# Patient Record
Sex: Female | Born: 1950
Health system: Southern US, Community
[De-identification: ages and names within clinical notes are randomized; demographics above are authoritative.]

## PROBLEM LIST (undated history)

## (undated) DIAGNOSIS — E785 Hyperlipidemia, unspecified: Secondary | ICD-10-CM

## (undated) DIAGNOSIS — IMO0002 Reserved for concepts with insufficient information to code with codable children: Secondary | ICD-10-CM

## (undated) DIAGNOSIS — E079 Disorder of thyroid, unspecified: Secondary | ICD-10-CM

## (undated) DIAGNOSIS — F329 Major depressive disorder, single episode, unspecified: Secondary | ICD-10-CM

## (undated) DIAGNOSIS — M1990 Unspecified osteoarthritis, unspecified site: Secondary | ICD-10-CM

## (undated) DIAGNOSIS — F419 Anxiety disorder, unspecified: Secondary | ICD-10-CM

## (undated) DIAGNOSIS — J449 Chronic obstructive pulmonary disease, unspecified: Secondary | ICD-10-CM

## (undated) DIAGNOSIS — K219 Gastro-esophageal reflux disease without esophagitis: Secondary | ICD-10-CM

## (undated) DIAGNOSIS — T7840XA Allergy, unspecified, initial encounter: Secondary | ICD-10-CM

## (undated) DIAGNOSIS — M810 Age-related osteoporosis without current pathological fracture: Secondary | ICD-10-CM

## (undated) DIAGNOSIS — F32A Depression, unspecified: Secondary | ICD-10-CM

## (undated) HISTORY — DX: Allergy, unspecified, initial encounter: T78.40XA

## (undated) HISTORY — DX: Age-related osteoporosis without current pathological fracture: M81.0

## (undated) HISTORY — DX: Major depressive disorder, single episode, unspecified: F32.9

## (undated) HISTORY — DX: Anxiety disorder, unspecified: F41.9

## (undated) HISTORY — DX: Hyperlipidemia, unspecified: E78.5

## (undated) HISTORY — DX: Disorder of thyroid, unspecified: E07.9

## (undated) HISTORY — DX: Reserved for concepts with insufficient information to code with codable children: IMO0002

## (undated) HISTORY — DX: Gastro-esophageal reflux disease without esophagitis: K21.9

## (undated) HISTORY — DX: Chronic obstructive pulmonary disease, unspecified: J44.9

## (undated) HISTORY — DX: Depression, unspecified: F32.A

## (undated) HISTORY — DX: Unspecified osteoarthritis, unspecified site: M19.90

---

## 1998-09-14 ENCOUNTER — Ambulatory Visit (HOSPITAL_COMMUNITY): Admission: RE | Admit: 1998-09-14 | Discharge: 1998-09-14 | Payer: Self-pay

## 1998-09-14 ENCOUNTER — Encounter: Payer: Self-pay | Admitting: Family Medicine

## 1998-10-01 ENCOUNTER — Emergency Department (HOSPITAL_COMMUNITY): Admission: EM | Admit: 1998-10-01 | Discharge: 1998-10-01 | Payer: Self-pay | Admitting: Emergency Medicine

## 1998-10-12 ENCOUNTER — Other Ambulatory Visit: Admission: RE | Admit: 1998-10-12 | Discharge: 1998-10-12 | Payer: Self-pay | Admitting: Obstetrics & Gynecology

## 2000-03-22 ENCOUNTER — Other Ambulatory Visit: Admission: RE | Admit: 2000-03-22 | Discharge: 2000-03-22 | Payer: Self-pay | Admitting: Obstetrics and Gynecology

## 2001-04-04 ENCOUNTER — Other Ambulatory Visit: Admission: RE | Admit: 2001-04-04 | Discharge: 2001-04-04 | Payer: Self-pay | Admitting: Obstetrics and Gynecology

## 2002-04-17 ENCOUNTER — Other Ambulatory Visit: Admission: RE | Admit: 2002-04-17 | Discharge: 2002-04-17 | Payer: Self-pay | Admitting: Obstetrics and Gynecology

## 2003-03-01 ENCOUNTER — Emergency Department (HOSPITAL_COMMUNITY): Admission: EM | Admit: 2003-03-01 | Discharge: 2003-03-01 | Payer: Self-pay | Admitting: Emergency Medicine

## 2003-04-20 ENCOUNTER — Other Ambulatory Visit: Admission: RE | Admit: 2003-04-20 | Discharge: 2003-04-20 | Payer: Self-pay | Admitting: Obstetrics and Gynecology

## 2003-07-13 ENCOUNTER — Ambulatory Visit (HOSPITAL_COMMUNITY): Admission: RE | Admit: 2003-07-13 | Discharge: 2003-07-13 | Payer: Self-pay | Admitting: Family Medicine

## 2004-04-21 ENCOUNTER — Other Ambulatory Visit: Admission: RE | Admit: 2004-04-21 | Discharge: 2004-04-21 | Payer: Self-pay | Admitting: Obstetrics and Gynecology

## 2004-06-14 ENCOUNTER — Ambulatory Visit (HOSPITAL_COMMUNITY): Admission: RE | Admit: 2004-06-14 | Discharge: 2004-06-14 | Payer: Self-pay | Admitting: Family Medicine

## 2005-04-13 ENCOUNTER — Other Ambulatory Visit: Admission: RE | Admit: 2005-04-13 | Discharge: 2005-04-13 | Payer: Self-pay | Admitting: Obstetrics and Gynecology

## 2007-05-20 ENCOUNTER — Emergency Department (HOSPITAL_COMMUNITY): Admission: EM | Admit: 2007-05-20 | Discharge: 2007-05-20 | Payer: Self-pay | Admitting: Emergency Medicine

## 2007-06-27 LAB — HM MAMMOGRAPHY: HM Mammogram: NEGATIVE

## 2007-09-19 ENCOUNTER — Encounter: Admission: RE | Admit: 2007-09-19 | Discharge: 2007-09-19 | Payer: Self-pay | Admitting: Family Medicine

## 2008-07-26 ENCOUNTER — Emergency Department (HOSPITAL_BASED_OUTPATIENT_CLINIC_OR_DEPARTMENT_OTHER): Admission: EM | Admit: 2008-07-26 | Discharge: 2008-07-26 | Payer: Self-pay | Admitting: Emergency Medicine

## 2008-07-26 ENCOUNTER — Ambulatory Visit: Payer: Self-pay | Admitting: Diagnostic Radiology

## 2009-01-05 IMAGING — MG MM SCREEN MAMMOGRAM BILATERAL
4 series · 4 of 4 positions shown · non-contrast
Comparison: none

DG SCREEN MAMMOGRAM BILATERAL
Bilateral CC and MLO view(s) were taken.

DIGITAL SCREENING MAMMOGRAM WITH CAD:
The breast tissue is heterogeneously dense.  No masses or malignant type calcifications are 
identified.

[R CC]
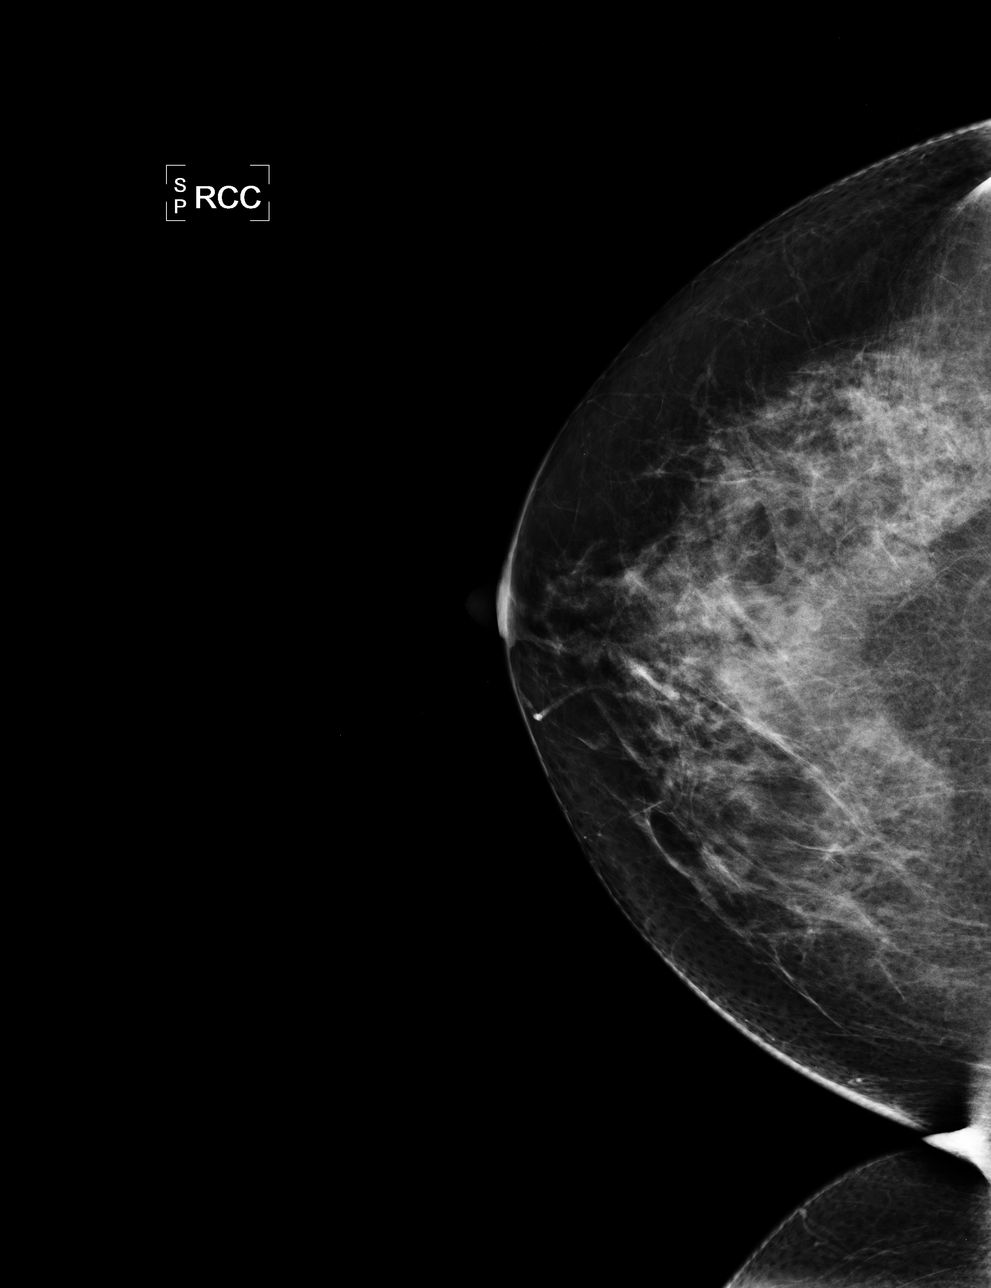

[L CC]
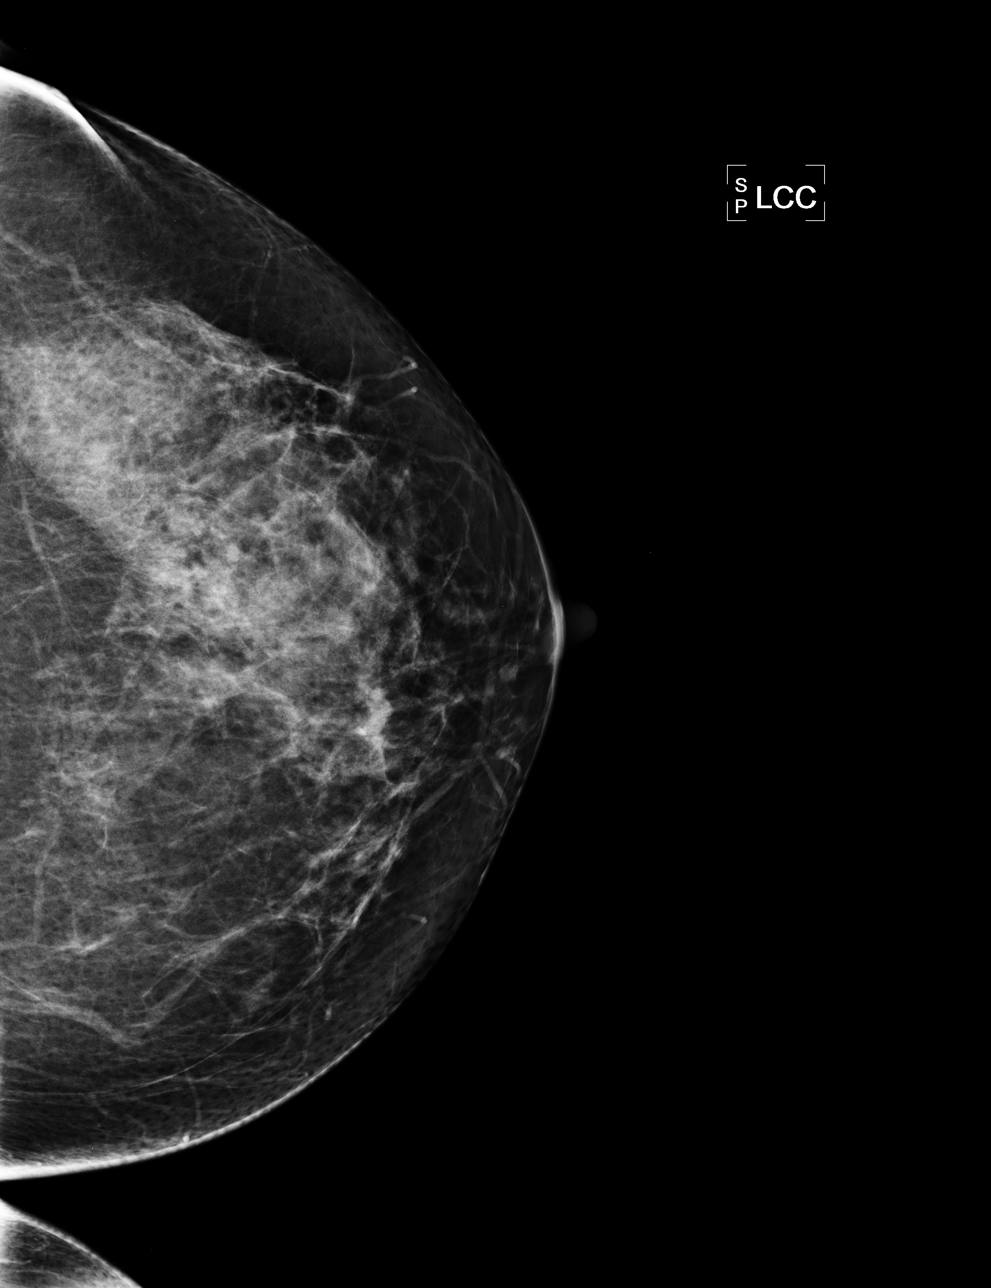

[L MLO]
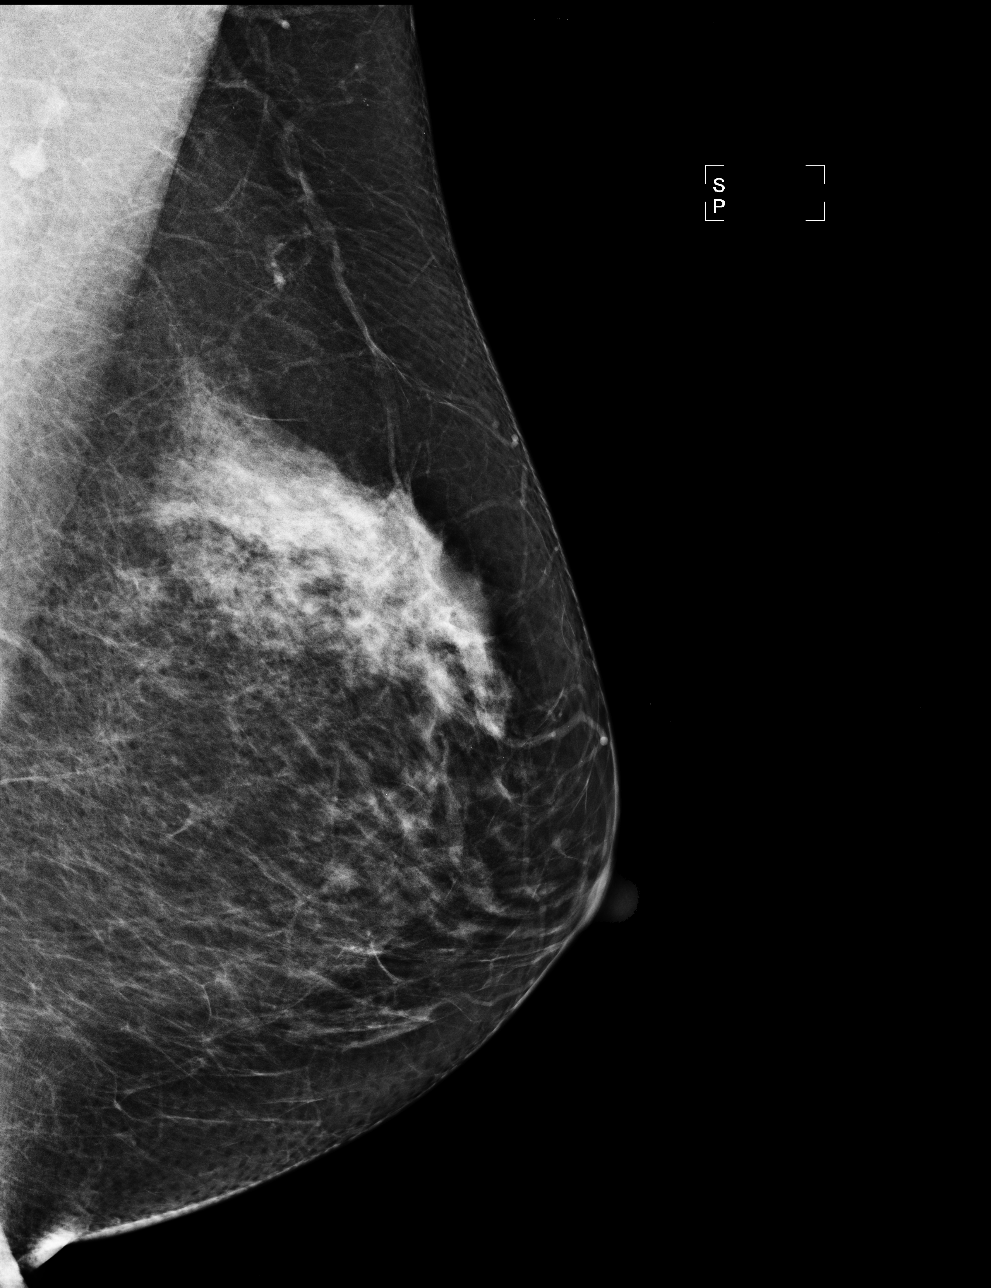

[R MLO]
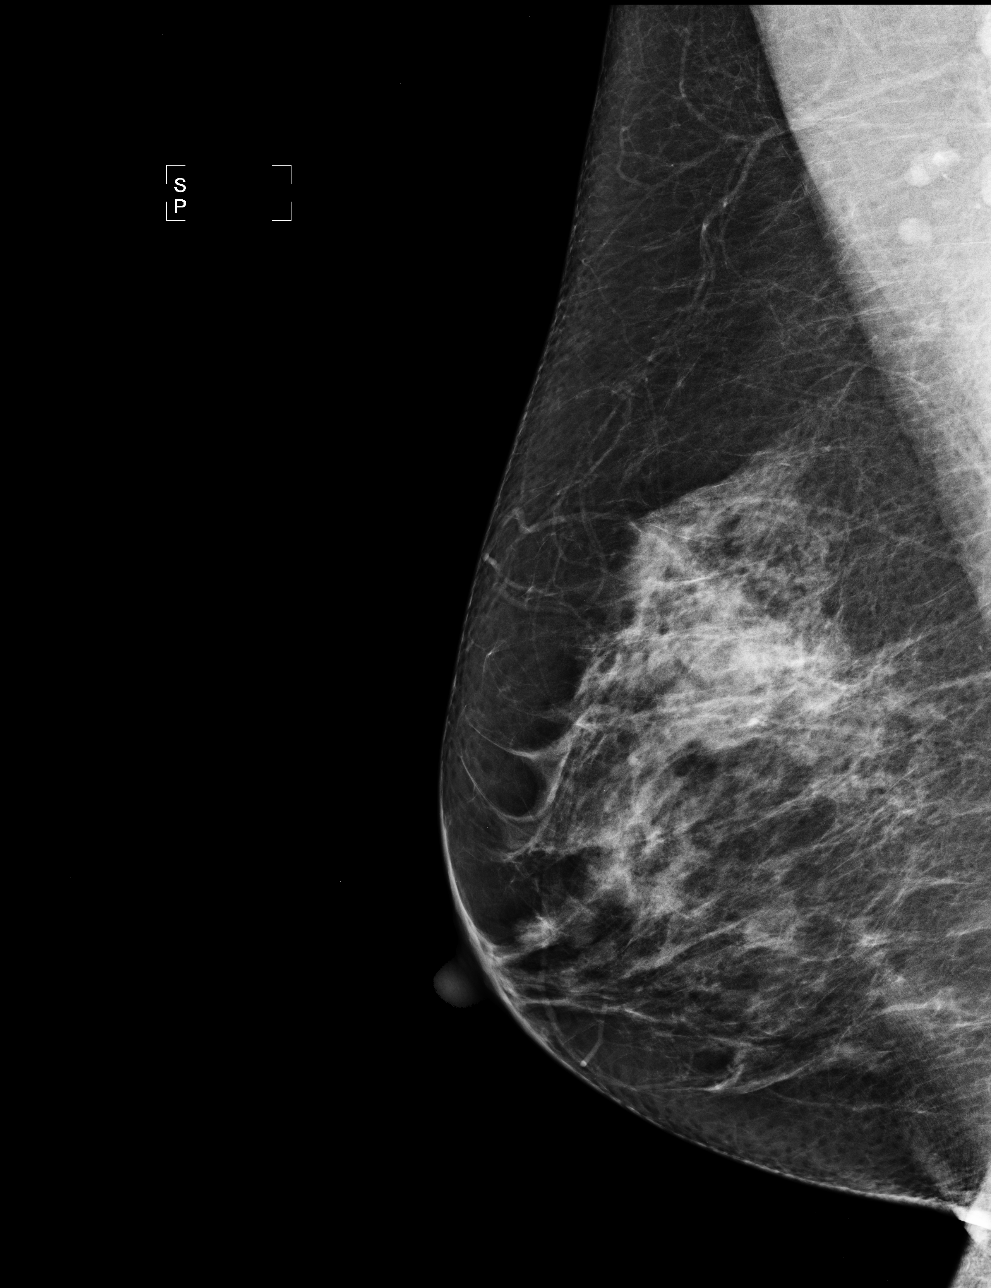

[4 of 4 positions shown; findings below may reference images not displayed]

IMPRESSION: No specific mammographic evidence of malignancy.  Next screening mammogram is recommended in one 
year.

ASSESSMENT: Negative - BI-RADS 1

Screening mammogram in 1 year.
ANALYZED BY COMPUTER AIDED DETECTION. , THIS PROCEDURE WAS A DIGITAL MAMMOGRAM.

## 2009-11-24 LAB — HM PAP SMEAR: HM Pap smear: NORMAL

## 2010-04-21 ENCOUNTER — Ambulatory Visit: Payer: Self-pay | Admitting: Diagnostic Radiology

## 2010-04-21 ENCOUNTER — Emergency Department (HOSPITAL_BASED_OUTPATIENT_CLINIC_OR_DEPARTMENT_OTHER): Admission: EM | Admit: 2010-04-21 | Discharge: 2010-04-21 | Payer: Self-pay | Admitting: Emergency Medicine

## 2010-07-10 ENCOUNTER — Emergency Department (HOSPITAL_BASED_OUTPATIENT_CLINIC_OR_DEPARTMENT_OTHER)
Admission: EM | Admit: 2010-07-10 | Discharge: 2010-07-10 | Payer: Self-pay | Source: Home / Self Care | Admitting: Emergency Medicine

## 2010-07-11 LAB — URINALYSIS, ROUTINE W REFLEX MICROSCOPIC
Bilirubin Urine: NEGATIVE
Ketones, ur: NEGATIVE mg/dL
Leukocytes, UA: NEGATIVE
Nitrite: NEGATIVE
Protein, ur: NEGATIVE mg/dL
Specific Gravity, Urine: 1.003 — ABNORMAL LOW (ref 1.005–1.030)
Urine Glucose, Fasting: NEGATIVE mg/dL
Urobilinogen, UA: 0.2 mg/dL (ref 0.0–1.0)
pH: 6 (ref 5.0–8.0)

## 2010-07-11 LAB — DIFFERENTIAL
Basophils Absolute: 0 10*3/uL (ref 0.0–0.1)
Basophils Relative: 0 % (ref 0–1)
Eosinophils Absolute: 0.1 10*3/uL (ref 0.0–0.7)
Eosinophils Relative: 1 % (ref 0–5)
Lymphocytes Relative: 31 % (ref 12–46)
Lymphs Abs: 2.6 10*3/uL (ref 0.7–4.0)
Monocytes Absolute: 0.7 10*3/uL (ref 0.1–1.0)
Monocytes Relative: 8 % (ref 3–12)
Neutro Abs: 4.9 10*3/uL (ref 1.7–7.7)
Neutrophils Relative %: 59 % (ref 43–77)

## 2010-07-11 LAB — CBC
HCT: 39 % (ref 36.0–46.0)
Hemoglobin: 13.4 g/dL (ref 12.0–15.0)
MCH: 30.3 pg (ref 26.0–34.0)
MCHC: 34.4 g/dL (ref 30.0–36.0)
MCV: 88.2 fL (ref 78.0–100.0)
Platelets: 266 10*3/uL (ref 150–400)
RBC: 4.42 MIL/uL (ref 3.87–5.11)
RDW: 13 % (ref 11.5–15.5)
WBC: 8.2 10*3/uL (ref 4.0–10.5)

## 2010-07-11 LAB — POCT CARDIAC MARKERS
CKMB, poc: <1 ng/mL — ABNORMAL LOW (ref 1.0–8.0)
Myoglobin, poc: 41.8 ng/mL (ref 12–200)
Troponin i, poc: <0.05 ng/mL (ref 0.00–0.09)

## 2010-07-11 LAB — BASIC METABOLIC PANEL
BUN: 11 mg/dL (ref 6–23)
CO2: 28 mEq/L (ref 19–32)
Calcium: 10.1 mg/dL (ref 8.4–10.5)
Chloride: 108 mEq/L (ref 96–112)
Creatinine, Ser: 0.8 mg/dL (ref 0.4–1.2)
GFR calc Af Amer: >60 mL/min (ref >60)
GFR calc non Af Amer: >60 mL/min (ref >60)
Glucose, Bld: 102 mg/dL — ABNORMAL HIGH (ref 70–99)
Potassium: 4.8 mEq/L (ref 3.5–5.1)
Sodium: 146 mEq/L — ABNORMAL HIGH (ref 135–145)

## 2010-07-11 LAB — URINE MICROSCOPIC-ADD ON

## 2010-09-07 LAB — BASIC METABOLIC PANEL
BUN: 10 mg/dL (ref 6–23)
CO2: 28 mEq/L (ref 19–32)
Calcium: 10.1 mg/dL (ref 8.4–10.5)
Chloride: 107 mEq/L (ref 96–112)
Creatinine, Ser: 0.9 mg/dL (ref 0.4–1.2)
GFR calc Af Amer: >60 mL/min (ref >60)
GFR calc non Af Amer: >60 mL/min (ref >60)
Glucose, Bld: 112 mg/dL — ABNORMAL HIGH (ref 70–99)
Potassium: 4 mEq/L (ref 3.5–5.1)
Sodium: 146 mEq/L — ABNORMAL HIGH (ref 135–145)

## 2010-09-07 LAB — DIFFERENTIAL
Basophils Absolute: 0.1 10*3/uL (ref 0.0–0.1)
Basophils Relative: 1 % (ref 0–1)
Eosinophils Absolute: 0.2 10*3/uL (ref 0.0–0.7)
Eosinophils Relative: 2 % (ref 0–5)
Lymphocytes Relative: 29 % (ref 12–46)
Lymphs Abs: 2.4 10*3/uL (ref 0.7–4.0)
Monocytes Absolute: 0.5 10*3/uL (ref 0.1–1.0)
Monocytes Relative: 6 % (ref 3–12)
Neutro Abs: 5.1 10*3/uL (ref 1.7–7.7)
Neutrophils Relative %: 63 % (ref 43–77)

## 2010-09-07 LAB — CBC
HCT: 40.3 % (ref 36.0–46.0)
Hemoglobin: 14.4 g/dL (ref 12.0–15.0)
MCH: 31.8 pg (ref 26.0–34.0)
MCHC: 35.7 g/dL (ref 30.0–36.0)
MCV: 89.1 fL (ref 78.0–100.0)
Platelets: 268 10*3/uL (ref 150–400)
RBC: 4.52 MIL/uL (ref 3.87–5.11)
RDW: 12.3 % (ref 11.5–15.5)
WBC: 8.3 10*3/uL (ref 4.0–10.5)

## 2010-10-10 LAB — DIFFERENTIAL
Basophils Absolute: 0.1 10*3/uL (ref 0.0–0.1)
Basophils Relative: 1 % (ref 0–1)
Eosinophils Absolute: 0.2 10*3/uL (ref 0.0–0.7)
Eosinophils Relative: 3 % (ref 0–5)
Lymphocytes Relative: 34 % (ref 12–46)
Lymphs Abs: 2.2 10*3/uL (ref 0.7–4.0)
Monocytes Absolute: 0.4 10*3/uL (ref 0.1–1.0)
Monocytes Relative: 7 % (ref 3–12)
Neutro Abs: 3.6 10*3/uL (ref 1.7–7.7)
Neutrophils Relative %: 56 % (ref 43–77)

## 2010-10-10 LAB — BASIC METABOLIC PANEL
BUN: 12 mg/dL (ref 6–23)
CO2: 29 mEq/L (ref 19–32)
Calcium: 9.6 mg/dL (ref 8.4–10.5)
Chloride: 105 mEq/L (ref 96–112)
Creatinine, Ser: 0.9 mg/dL (ref 0.4–1.2)
GFR calc Af Amer: >60 mL/min (ref >60)
GFR calc non Af Amer: >60 mL/min (ref >60)
Glucose, Bld: 116 mg/dL — ABNORMAL HIGH (ref 70–99)
Potassium: 4.5 mEq/L (ref 3.5–5.1)
Sodium: 141 mEq/L (ref 135–145)

## 2010-10-10 LAB — CBC
HCT: 41.1 % (ref 36.0–46.0)
Hemoglobin: 14.1 g/dL (ref 12.0–15.0)
MCHC: 34.3 g/dL (ref 30.0–36.0)
MCV: 92.3 fL (ref 78.0–100.0)
Platelets: 249 10*3/uL (ref 150–400)
RBC: 4.46 MIL/uL (ref 3.87–5.11)
RDW: 12.4 % (ref 11.5–15.5)
WBC: 6.5 10*3/uL (ref 4.0–10.5)

## 2010-10-10 LAB — POCT CARDIAC MARKERS
CKMB, poc: <1 ng/mL — ABNORMAL LOW (ref 1.0–8.0)
Myoglobin, poc: 30.3 ng/mL (ref 12–200)
Troponin i, poc: <0.05 ng/mL (ref 0.00–0.09)

## 2011-02-16 ENCOUNTER — Encounter: Payer: Self-pay | Admitting: Internal Medicine

## 2011-02-16 ENCOUNTER — Ambulatory Visit (INDEPENDENT_AMBULATORY_CARE_PROVIDER_SITE_OTHER): Payer: BC Managed Care – PPO | Admitting: Internal Medicine

## 2011-02-16 VITALS — BP 126/80 | HR 80 | Temp 98.3°F | Resp 16 | Ht 63.5 in | Wt 160.0 lb

## 2011-02-16 DIAGNOSIS — Z0000 Encounter for general adult medical examination without abnormal findings: Secondary | ICD-10-CM

## 2011-02-16 LAB — CBC WITH DIFFERENTIAL/PLATELET
Basophils Relative: 0.5 % (ref 0.0–3.0)
Hemoglobin: 14.5 g/dL (ref 12.0–15.0)
Lymphocytes Relative: 36.8 % (ref 12.0–46.0)
Monocytes Relative: 5.1 % (ref 3.0–12.0)
Neutro Abs: 4.1 10*3/uL (ref 1.4–7.7)
Neutrophils Relative %: 55.7 % (ref 43.0–77.0)
RBC: 4.6 Mil/uL (ref 3.87–5.11)
WBC: 7.3 10*3/uL (ref 4.5–10.5)

## 2011-02-16 LAB — BASIC METABOLIC PANEL
Calcium: 10.4 mg/dL (ref 8.4–10.5)
Creatinine, Ser: 0.7 mg/dL (ref 0.4–1.2)
GFR: 94.14 mL/min (ref >60.00)
Sodium: 141 mEq/L (ref 135–145)

## 2011-02-16 LAB — HEPATIC FUNCTION PANEL
ALT: 15 U/L (ref 0–35)
AST: 20 U/L (ref 0–37)
Albumin: 4.5 g/dL (ref 3.5–5.2)
Total Protein: 7.7 g/dL (ref 6.0–8.3)

## 2011-02-16 MED ORDER — OMEPRAZOLE 20 MG PO CPDR: 20.0000 mg | DELAYED_RELEASE_CAPSULE | Freq: Every day | ORAL | Status: DC

## 2011-02-16 MED ORDER — ALPRAZOLAM 0.25 MG PO TABS: 0.2500 mg | ORAL_TABLET | Freq: Two times a day (BID) | ORAL | Status: DC

## 2011-02-16 MED ORDER — LEVOTHYROXINE SODIUM 75 MCG PO TABS: 75.0000 ug | ORAL_TABLET | Freq: Every day | ORAL | Status: DC

## 2011-02-16 NOTE — Patient Instructions (Signed)
Smoking tobacco is very bad for your health. You should stop smoking immediately.  Take a calcium supplement, plus [phone] units of vitamin D    It is important that you exercise regularly, at least 20 minutes 3 to 4 times per week.  If you develop chest pain or shortness of breath seek  medical attention.  Return in 6 months for follow-up  Schedule your colonoscopy to help detect colon cancer.

## 2011-02-16 NOTE — Progress Notes (Signed)
Subjective:    Patient ID: Tonya Cherry, female    DOB: 11-06-1950, 60 y.o.   MRN: [id number]  HPI  60 year old patient who is seen today to establish with our practice. Her husband is followed at this practice. She has a history of mild dyslipidemia and long history of anxiety disorder with panic attacks she has been on twice daily alprazolam for a number of years. She was diagnosed with hypothyroidism in the late 80s and is also on supplemental Synthroid. She has gastro-a soft reflux disease. She has a history of ongoing tobacco use past surgical history is fairly unremarkable she is gravida 4 para 3 abortus 1 she has had a C-section in 47. No other surgeries. Medical regimen includes Synthroid and Xanax and daily aspirin. She has been on Prilosec in the past. She also has a history of the foot eczema and has been on the high potency steroids. Family history her father died at 43 complications of prostate cancer and history of EtOH. Mother age 59 has dementia also remote history of coronary artery disease and cerebrovascular disease. 3 brothers one died at age 33 of melanoma one sister with hypertension Social history she is married 3 children 4 grandchildren works as an Advertising account planner he is a one pack per day smoker    Review of Systems  Constitutional: Negative for fever, appetite change, fatigue and unexpected weight change.  HENT: Negative for hearing loss, ear pain, nosebleeds, congestion, sore throat, mouth sores, trouble swallowing, neck stiffness, dental problem, voice change, sinus pressure and tinnitus.   Eyes: Negative for photophobia, pain, redness and visual disturbance.  Respiratory: Negative for cough, chest tightness and shortness of breath.   Cardiovascular: Negative for chest pain, palpitations and leg swelling.  Gastrointestinal: Negative for nausea, vomiting, abdominal pain, diarrhea, constipation, blood in stool, abdominal distention and rectal pain.  Genitourinary:  Negative for dysuria, urgency, frequency, hematuria, flank pain, vaginal bleeding, vaginal discharge, difficulty urinating, genital sores, vaginal pain, menstrual problem and pelvic pain.  Musculoskeletal: Negative for back pain and arthralgias.  Skin: Positive for rash.  Neurological: Negative for dizziness, syncope, speech difficulty, weakness, light-headedness, numbness and headaches.  Hematological: Negative for adenopathy. Does not bruise/bleed easily.  Psychiatric/Behavioral: Negative for suicidal ideas, behavioral problems, self-injury, dysphoric mood and agitation. The patient is not nervous/anxious.        Objective:   Physical Exam  Constitutional: She is oriented to person, place, and time. She appears well-developed and well-nourished.  HENT:  Head: Normocephalic and atraumatic.  Right Ear: External ear normal.  Left Ear: External ear normal.  Mouth/Throat: Oropharynx is clear and moist.  Eyes: Conjunctivae and EOM are normal.  Neck: Normal range of motion. Neck supple. No JVD present. No thyromegaly present.  Cardiovascular: Normal rate, regular rhythm, normal heart sounds and intact distal pulses.   No murmur heard. Pulmonary/Chest: Effort normal and breath sounds normal. She has no wheezes. She has no rales.  Abdominal: Soft. Bowel sounds are normal. She exhibits no distension and no mass. There is no tenderness. There is no rebound and no guarding.  Musculoskeletal: Normal range of motion. She exhibits no edema and no tenderness.  Neurological: She is alert and oriented to person, place, and time. She has normal reflexes. No cranial nerve deficit. She exhibits normal muscle tone. Coordination normal.  Skin: Skin is warm and dry. Rash noted.       Thick dry scaly rash involving the plantar aspects of the feet  Psychiatric: She has a normal  mood and affect. Her behavior is normal.          Assessment & Plan:   Anxiety disorder Tobacco abuse History of  gastroesophageal reflux disease  Eczema of the feet Hypothyroidism  We'll check labs. Including TSH cessation of smoking encouraged medical regimen unchanged Followup gynecology Calcium and vitamin D supplementation was encouraged

## 2011-03-06 ENCOUNTER — Encounter: Payer: Self-pay | Admitting: Internal Medicine

## 2011-03-13 ENCOUNTER — Telehealth: Payer: Self-pay | Admitting: Internal Medicine

## 2011-03-13 NOTE — Telephone Encounter (Addendum)
Pt would like blood work results °

## 2011-03-13 NOTE — Telephone Encounter (Signed)
Please call/notify patient that lab/test/procedure is normal 

## 2011-03-13 NOTE — Telephone Encounter (Signed)
Attempt to call- VM - labs normal

## 2011-03-13 NOTE — Telephone Encounter (Signed)
Please advise 

## 2011-04-04 ENCOUNTER — Encounter: Payer: Self-pay | Admitting: Internal Medicine

## 2011-04-04 ENCOUNTER — Ambulatory Visit (INDEPENDENT_AMBULATORY_CARE_PROVIDER_SITE_OTHER): Payer: BC Managed Care – PPO | Admitting: Internal Medicine

## 2011-04-04 VITALS — BP 118/80 | Temp 98.4°F | Wt 161.0 lb

## 2011-04-04 DIAGNOSIS — J019 Acute sinusitis, unspecified: Secondary | ICD-10-CM

## 2011-04-04 LAB — CBC
Hemoglobin: 14
MCHC: 34.3
MCV: 90.8
RBC: 4.5
WBC: 8.6

## 2011-04-04 LAB — COMPREHENSIVE METABOLIC PANEL
ALT: 17
Albumin: 4.2
Alkaline Phosphatase: 76
BUN: 6
Chloride: 99
Potassium: 4.1
Sodium: 135
Total Bilirubin: 0.7

## 2011-04-04 LAB — DIFFERENTIAL
Basophils Absolute: 0
Basophils Relative: 0
Eosinophils Absolute: 0.1 — ABNORMAL LOW
Eosinophils Relative: 2
Monocytes Absolute: 0.5
Monocytes Relative: 6
Neutro Abs: 5.5

## 2011-04-04 LAB — POCT CARDIAC MARKERS: Myoglobin, poc: 75.5

## 2011-04-04 MED ORDER — DOXYCYCLINE HYCLATE 100 MG PO TABS: 100.0000 mg | ORAL_TABLET | Freq: Two times a day (BID) | ORAL | Status: AC

## 2011-04-04 MED ORDER — DOXYCYCLINE HYCLATE 50 MG PO CAPS: 100.0000 mg | ORAL_CAPSULE | Freq: Two times a day (BID) | ORAL | Status: DC

## 2011-04-04 NOTE — Progress Notes (Signed)
  Subjective:    Patient ID: Tonya Cherry, female    DOB: 1951-06-23, 60 y.o.   MRN: [id number]  HPI  60 year old patient who presents with a 12 day history of URI symptoms. More recently she has developed green drainage from both her sinuses and some productive cough. There's been no documented fever she describes headache increasing sinus congestion and maxillary sinus discomfort. She has been using the OTC medicines without much benefit. She has hypothyroidism and gastroesophageal reflux disease which has been stable. She is a one pack per day smoker    Review of Systems  Constitutional: Negative.   HENT: Positive for congestion and rhinorrhea. Negative for hearing loss, sore throat, dental problem, sinus pressure and tinnitus.   Eyes: Negative for pain, discharge and visual disturbance.  Respiratory: Positive for cough. Negative for shortness of breath.   Cardiovascular: Negative for chest pain, palpitations and leg swelling.  Gastrointestinal: Negative for nausea, vomiting, abdominal pain, diarrhea, constipation, blood in stool and abdominal distention.  Genitourinary: Negative for dysuria, urgency, frequency, hematuria, flank pain, vaginal bleeding, vaginal discharge, difficulty urinating, vaginal pain and pelvic pain.  Musculoskeletal: Negative for joint swelling, arthralgias and gait problem.  Skin: Negative for rash.  Neurological: Negative for dizziness, syncope, speech difficulty, weakness, numbness and headaches.  Hematological: Negative for adenopathy.  Psychiatric/Behavioral: Negative for behavioral problems, dysphoric mood and agitation. The patient is not nervous/anxious.        Objective:   Physical Exam  Constitutional: She is oriented to person, place, and time. She appears well-developed and well-nourished.  HENT:  Head: Normocephalic.  Right Ear: External ear normal.  Left Ear: External ear normal.  Mouth/Throat: Oropharynx is clear and moist.       Maxillary  sinus tenderness  Eyes: Conjunctivae and EOM are normal. Pupils are equal, round, and reactive to light.  Neck: Normal range of motion. Neck supple. No thyromegaly present.  Cardiovascular: Normal rate, regular rhythm, normal heart sounds and intact distal pulses.   Pulmonary/Chest: Effort normal and breath sounds normal.  Abdominal: Soft. Bowel sounds are normal. She exhibits no mass. There is no tenderness.  Musculoskeletal: Normal range of motion.  Lymphadenopathy:    She has no cervical adenopathy.  Neurological: She is alert and oriented to person, place, and time.  Skin: Skin is warm and dry. No rash noted.  Psychiatric: She has a normal mood and affect. Her behavior is normal.          Assessment & Plan:   Subacute sinusitis. Will place on expectorants decongestants and antibiotic therapy Tobacco abuse cessation of smoking encouraged

## 2011-04-04 NOTE — Patient Instructions (Signed)
Smoking tobacco is very bad for your health. You should stop smoking immediately.  MUCINEX  D. one twice daily for 7 days  Okay to use Afrin nasal spray twice daily for 3-5 days  Antibiotic therapy was prescribed for the child due to specific medical indications.

## 2011-08-07 ENCOUNTER — Ambulatory Visit (INDEPENDENT_AMBULATORY_CARE_PROVIDER_SITE_OTHER): Payer: BC Managed Care – PPO | Admitting: Internal Medicine

## 2011-08-07 ENCOUNTER — Encounter: Payer: Self-pay | Admitting: Internal Medicine

## 2011-08-07 VITALS — BP 120/80 | Temp 98.5°F | Wt 159.0 lb

## 2011-08-07 DIAGNOSIS — K219 Gastro-esophageal reflux disease without esophagitis: Secondary | ICD-10-CM

## 2011-08-07 MED ORDER — ALPRAZOLAM 0.25 MG PO TABS: ORAL_TABLET | ORAL | Status: DC

## 2011-08-07 NOTE — Progress Notes (Signed)
  Subjective:    Patient ID: Tonya Cherry, female    DOB: 09-22-1950, 61 y.o.   MRN: [id number]  HPI  61 year old patient who is seen today for followup. In the past several days she has had increasing GERD symptoms as well as epigastric discomfort. She has been on maintenance Prilosec. She has been under more situational stress due to the recent death of her 60 year old mother. She has had similar symptoms in the past with worsening GERD.    Review of Systems  Constitutional: Negative.   HENT: Negative for hearing loss, congestion, sore throat, rhinorrhea, dental problem, sinus pressure and tinnitus.   Eyes: Negative for pain, discharge and visual disturbance.  Respiratory: Negative for cough and shortness of breath.   Cardiovascular: Negative for chest pain, palpitations and leg swelling.  Gastrointestinal: Positive for abdominal pain. Negative for nausea, vomiting, diarrhea, constipation, blood in stool and abdominal distention.  Genitourinary: Negative for dysuria, urgency, frequency, hematuria, flank pain, vaginal bleeding, vaginal discharge, difficulty urinating, vaginal pain and pelvic pain.  Musculoskeletal: Negative for joint swelling, arthralgias and gait problem.  Skin: Negative for rash.  Neurological: Negative for dizziness, syncope, speech difficulty, weakness, numbness and headaches.  Hematological: Negative for adenopathy.  Psychiatric/Behavioral: Negative for behavioral problems, dysphoric mood and agitation. The patient is not nervous/anxious.        Objective:   Physical Exam  Constitutional: She is oriented to person, place, and time. She appears well-developed and well-nourished.  HENT:  Head: Normocephalic.  Right Ear: External ear normal.  Left Ear: External ear normal.  Mouth/Throat: Oropharynx is clear and moist.  Eyes: Conjunctivae and EOM are normal. Pupils are equal, round, and reactive to light.  Neck: Normal range of motion. Neck supple. No thyromegaly  present.  Cardiovascular: Normal rate, regular rhythm, normal heart sounds and intact distal pulses.   Pulmonary/Chest: Effort normal and breath sounds normal.  Abdominal: Soft. Bowel sounds are normal. She exhibits no mass. There is tenderness.       Mild epigastric tenderness  Musculoskeletal: Normal range of motion.  Lymphadenopathy:    She has no cervical adenopathy.  Neurological: She is alert and oriented to person, place, and time.  Skin: Skin is warm and dry. No rash noted.  Psychiatric: She has a normal mood and affect. Her behavior is normal.          Assessment & Plan:   Symptomatic gastroesophageal reflux disease. We'll intensify her antireflux regimen. Will place on Nexium 40 mg daily. She'll call if unimproved Anxiety disorder. Xanax refilled

## 2011-08-07 NOTE — Patient Instructions (Addendum)
Gastroesophageal Reflux Disease, Adult Gastroesophageal reflux disease (GERD) happens when acid from your stomach flows up into the esophagus. When acid comes in contact with the esophagus, the acid causes soreness (inflammation) in the esophagus. Over time, GERD may create small holes (ulcers) in the lining of the esophagus. CAUSES    Increased body weight. This puts pressure on the stomach, making acid rise from the stomach into the esophagus.     Smoking. This increases acid production in the stomach.     Drinking alcohol. This causes decreased pressure in the lower esophageal sphincter (valve or ring of muscle between the esophagus and stomach), allowing acid from the stomach into the esophagus.     Late evening meals and a full stomach. This increases pressure and acid production in the stomach.     A malformed lower esophageal sphincter.  Sometimes, no cause is found. SYMPTOMS    Burning pain in the lower part of the mid-chest behind the breastbone and in the mid-stomach area. This may occur twice a week or more often.     Trouble swallowing.     Sore throat.     Dry cough.     Asthma-like symptoms including chest tightness, shortness of breath, or wheezing.  DIAGNOSIS   Your caregiver may be able to diagnose GERD based on your symptoms. In some cases, X-rays and other tests may be done to check for complications or to check the condition of your stomach and esophagus. TREATMENT   Your caregiver may recommend over-the-counter or prescription medicines to help decrease acid production. Ask your caregiver before starting or adding any new medicines.   HOME CARE INSTRUCTIONS    Change the factors that you can control. Ask your caregiver for guidance concerning weight loss, quitting smoking, and alcohol consumption.     Avoid foods and drinks that make your symptoms worse, such as:     Caffeine or alcoholic drinks.     Chocolate.    Peppermint or mint flavorings.     Garlic  and onions.     Spicy foods.     Citrus fruits, such as oranges, lemons, or limes.     Tomato-based foods such as sauce, chili, salsa, and pizza.     Fried and fatty foods.     Avoid lying down for the 3 hours prior to your bedtime or prior to taking a nap.     Eat small, frequent meals instead of large meals.     Wear loose-fitting clothing. Do not wear anything tight around your waist that causes pressure on your stomach.     Raise the head of your bed 6 to 8 inches with wood blocks to help you sleep. Extra pillows will not help.     Only take over-the-counter or prescription medicines for pain, discomfort, or fever as directed by your caregiver.     Do not take aspirin, ibuprofen, or other nonsteroidal anti-inflammatory drugs (NSAIDs).  SEEK IMMEDIATE MEDICAL CARE IF:    You have pain in your arms, neck, jaw, teeth, or back.     Your pain increases or changes in intensity or duration.     You develop nausea, vomiting, or sweating (diaphoresis).     You develop shortness of breath, or you faint.     Your vomit is green, yellow, black, or looks like coffee grounds or blood.     Your stool is red, bloody, or black.  These symptoms could be signs of other problems, such as heart  disease, gastric bleeding, or esophageal bleeding. MAKE SURE YOU:    Understand these instructions.     Will watch your condition.     Will get help right away if you are not doing well or get worse.  Document Released: 03/22/2005 Document Revised: 02/22/2011 Document Reviewed: 12/30/2010 [facility] Patient Information 2012 [place], [place].  Avoids foods high in acid such as tomatoes citrus juices, and spicy foods.  Avoid eating within two hours of lying down or before exercising.  Do not overheat.  Try smaller more frequent meals.  If symptoms persist, elevate the head of her bed 12 inches while sleeping. Call or return to clinic prn if these symptoms worsen or fail to improve as anticipated.

## 2011-10-09 ENCOUNTER — Other Ambulatory Visit: Payer: Self-pay | Admitting: Obstetrics and Gynecology

## 2011-10-09 DIAGNOSIS — Z1231 Encounter for screening mammogram for malignant neoplasm of breast: Secondary | ICD-10-CM

## 2011-10-12 ENCOUNTER — Ambulatory Visit
Admission: RE | Admit: 2011-10-12 | Discharge: 2011-10-12 | Disposition: A | Discharge status: Home / Self Care | Payer: BC Managed Care – PPO | Source: Ambulatory Visit | Attending: Obstetrics and Gynecology | Admitting: Obstetrics and Gynecology

## 2011-10-12 DIAGNOSIS — Z1231 Encounter for screening mammogram for malignant neoplasm of breast: Secondary | ICD-10-CM

## 2012-01-04 ENCOUNTER — Ambulatory Visit (INDEPENDENT_AMBULATORY_CARE_PROVIDER_SITE_OTHER): Payer: BC Managed Care – PPO | Admitting: Internal Medicine

## 2012-01-04 ENCOUNTER — Encounter: Payer: Self-pay | Admitting: Internal Medicine

## 2012-01-04 VITALS — BP 110/70 | Temp 98.3°F | Wt 157.0 lb

## 2012-01-04 DIAGNOSIS — E039 Hypothyroidism, unspecified: Secondary | ICD-10-CM

## 2012-01-04 MED ORDER — OMEPRAZOLE 20 MG PO CPDR: 20.0000 mg | DELAYED_RELEASE_CAPSULE | Freq: Every day | ORAL | Status: DC

## 2012-01-04 MED ORDER — LEVOTHYROXINE SODIUM 75 MCG PO TABS: 75.0000 ug | ORAL_TABLET | Freq: Every day | ORAL | Status: DC

## 2012-01-04 MED ORDER — ALPRAZOLAM 0.25 MG PO TABS: ORAL_TABLET | ORAL | Status: DC

## 2012-01-04 NOTE — Patient Instructions (Signed)
Limit your sodium (Salt) intake    It is important that you exercise regularly, at least 20 minutes 3 to 4 times per week.  If you develop chest pain or shortness of breath seek  medical attention.  Return in 6 months for follow-up  

## 2012-01-04 NOTE — Progress Notes (Signed)
  Subjective:    Patient ID: Tonya Cherry, female    DOB: 1950/09/30, 61 y.o.   MRN: [id number]  HPI  61  -year-old white female who is seen today for followup. She has a history of treated hypothyroidism as well as gastroesophageal reflux disease he has also history of anxiety disorder and does use Xanax when necessary. She is feeling well today. Her reflux symptoms are well controlled  Review of Systems  Constitutional: Negative.   HENT: Negative for hearing loss, congestion, sore throat, rhinorrhea, dental problem, sinus pressure and tinnitus.   Eyes: Negative for pain, discharge and visual disturbance.  Respiratory: Negative for cough and shortness of breath.   Cardiovascular: Negative for chest pain, palpitations and leg swelling.  Gastrointestinal: Negative for nausea, vomiting, abdominal pain, diarrhea, constipation, blood in stool and abdominal distention.  Genitourinary: Negative for dysuria, urgency, frequency, hematuria, flank pain, vaginal bleeding, vaginal discharge, difficulty urinating, vaginal pain and pelvic pain.  Musculoskeletal: Negative for joint swelling, arthralgias and gait problem.  Skin: Negative for rash.  Neurological: Negative for dizziness, syncope, speech difficulty, weakness, numbness and headaches.  Hematological: Negative for adenopathy.  Psychiatric/Behavioral: Negative for behavioral problems, dysphoric mood and agitation. The patient is nervous/anxious.        Objective:   Physical Exam  Constitutional: She is oriented to person, place, and time. She appears well-developed and well-nourished.  HENT:  Head: Normocephalic.  Right Ear: External ear normal.  Left Ear: External ear normal.  Mouth/Throat: Oropharynx is clear and moist.  Eyes: Conjunctivae and EOM are normal. Pupils are equal, round, and reactive to light.  Neck: Normal range of motion. Neck supple. No thyromegaly present.  Cardiovascular: Normal rate, regular rhythm, normal heart sounds  and intact distal pulses.   Pulmonary/Chest: Effort normal and breath sounds normal.  Abdominal: Soft. Bowel sounds are normal. She exhibits no mass. There is no tenderness.  Musculoskeletal: Normal range of motion.  Lymphadenopathy:    She has no cervical adenopathy.  Neurological: She is alert and oriented to person, place, and time.  Skin: Skin is warm and dry. No rash noted.  Psychiatric: She has a normal mood and affect. Her behavior is normal.          Assessment & Plan:   Hypothyroidism. TSH was normal 6 months ago we'll continue present dosing. Gastroesophageal reflux disease. Stable we'll continue PPI and anti-reflux regimen. Examiner's order. Alprazolam refilled.  CPX 6 months

## 2012-01-05 DIAGNOSIS — E039 Hypothyroidism, unspecified: Secondary | ICD-10-CM | POA: Insufficient documentation

## 2012-05-02 ENCOUNTER — Other Ambulatory Visit (INDEPENDENT_AMBULATORY_CARE_PROVIDER_SITE_OTHER): Payer: BC Managed Care – PPO

## 2012-05-02 DIAGNOSIS — Z0000 Encounter for general adult medical examination without abnormal findings: Secondary | ICD-10-CM

## 2012-05-02 LAB — HEPATIC FUNCTION PANEL
ALT: 20 U/L (ref 0–35)
AST: 25 U/L (ref 0–37)
Albumin: 4.2 g/dL (ref 3.5–5.2)
Total Bilirubin: 0.5 mg/dL (ref 0.3–1.2)

## 2012-05-02 LAB — CBC WITH DIFFERENTIAL/PLATELET
Basophils Absolute: 0 10*3/uL (ref 0.0–0.1)
HCT: 42.4 % (ref 36.0–46.0)
Hemoglobin: 14 g/dL (ref 12.0–15.0)
Lymphs Abs: 2.1 10*3/uL (ref 0.7–4.0)
MCHC: 33 g/dL (ref 30.0–36.0)
MCV: 93 fl (ref 78.0–100.0)
Monocytes Absolute: 0.4 10*3/uL (ref 0.1–1.0)
Monocytes Relative: 7.9 % (ref 3.0–12.0)
Neutro Abs: 2.9 10*3/uL (ref 1.4–7.7)
Platelets: 271 10*3/uL (ref 150.0–400.0)
RDW: 13.2 % (ref 11.5–14.6)

## 2012-05-02 LAB — POCT URINALYSIS DIPSTICK
Bilirubin, UA: NEGATIVE
Glucose, UA: NEGATIVE
Leukocytes, UA: NEGATIVE
Nitrite, UA: NEGATIVE
Urobilinogen, UA: 0.2
pH, UA: 6.5

## 2012-05-02 LAB — BASIC METABOLIC PANEL
BUN: 12 mg/dL (ref 6–23)
CO2: 28 mEq/L (ref 19–32)
Chloride: 107 mEq/L (ref 96–112)
Glucose, Bld: 89 mg/dL (ref 70–99)
Potassium: 5.4 mEq/L — ABNORMAL HIGH (ref 3.5–5.1)
Sodium: 143 mEq/L (ref 135–145)

## 2012-05-02 LAB — TSH: TSH: 0.81 u[IU]/mL (ref 0.35–5.50)

## 2012-05-02 LAB — LIPID PANEL: HDL: 51.8 mg/dL (ref >39.00)

## 2012-05-02 LAB — LDL CHOLESTEROL, DIRECT: Direct LDL: 202.4 mg/dL

## 2012-05-09 ENCOUNTER — Encounter: Payer: Self-pay | Admitting: Internal Medicine

## 2012-05-09 ENCOUNTER — Ambulatory Visit (INDEPENDENT_AMBULATORY_CARE_PROVIDER_SITE_OTHER): Payer: BC Managed Care – PPO | Admitting: Internal Medicine

## 2012-05-09 VITALS — BP 128/80 | HR 84 | Temp 98.3°F | Resp 18 | Wt 156.0 lb

## 2012-05-09 DIAGNOSIS — F17200 Nicotine dependence, unspecified, uncomplicated: Secondary | ICD-10-CM

## 2012-05-09 DIAGNOSIS — E785 Hyperlipidemia, unspecified: Secondary | ICD-10-CM | POA: Insufficient documentation

## 2012-05-09 DIAGNOSIS — R7302 Impaired glucose tolerance (oral): Secondary | ICD-10-CM | POA: Insufficient documentation

## 2012-05-09 DIAGNOSIS — R0989 Other specified symptoms and signs involving the circulatory and respiratory systems: Secondary | ICD-10-CM

## 2012-05-09 DIAGNOSIS — Z0000 Encounter for general adult medical examination without abnormal findings: Secondary | ICD-10-CM

## 2012-05-09 DIAGNOSIS — Z720 Tobacco use: Secondary | ICD-10-CM | POA: Insufficient documentation

## 2012-05-09 DIAGNOSIS — R7309 Other abnormal glucose: Secondary | ICD-10-CM

## 2012-05-09 MED ORDER — ALPRAZOLAM 0.25 MG PO TABS: ORAL_TABLET | ORAL | Status: DC

## 2012-05-09 MED ORDER — LEVOTHYROXINE SODIUM 75 MCG PO TABS: 75.0000 ug | ORAL_TABLET | Freq: Every day | ORAL | Status: DC

## 2012-05-09 MED ORDER — OMEPRAZOLE 20 MG PO CPDR: 20.0000 mg | DELAYED_RELEASE_CAPSULE | Freq: Every day | ORAL | Status: DC

## 2012-05-09 NOTE — Patient Instructions (Addendum)
It is important that you exercise regularly, at least 20 minutes 3 to 4 times per week.  If you develop chest pain or shortness of breath seek  medical attention.  Smoking tobacco is very bad for your health. You should stop smoking immediately.    It is important that you exercise regularly, at least 20 minutes 3 to 4 times per week.  If you develop chest pain or shortness of breath seek  medical attention.  Hypercholesterolemia High Blood Cholesterol Cholesterol is a white, waxy, fat-like protein needed by your body in small amounts. The liver makes all the cholesterol you need. It is carried from the liver by the blood through the blood vessels. Deposits (plaque) may build up on blood vessel walls. This makes the arteries narrower and stiffer. Plaque increases the risk for heart attack and stroke. You cannot feel your cholesterol level even if it is very high. The only way to know is by a blood test to check your lipid (fats) levels. Once you know your cholesterol levels, you should keep a record of the test results. Work with your caregiver to to keep your levels in the desired range. WHAT THE RESULTS MEAN:  Total cholesterol is a rough measure of all the cholesterol in your blood.   LDL is the so-called bad cholesterol. This is the type that deposits cholesterol in the walls of the arteries. You want this level to be low.   HDL is the good cholesterol because it cleans the arteries and carries the LDL away. You want this level to be high.   Triglycerides are fat that the body can either burn for energy or store. High levels are closely linked to heart disease.  DESIRED LEVELS:  Total cholesterol below 200.   LDL below 100 for people at risk, below 70 for very high risk.   HDL above 50 is good, above 60 is best.   Triglycerides below 150.  HOW TO LOWER YOUR CHOLESTEROL:  Diet.   Choose fish or white meat chicken and [place], roasted or baked. Limit fatty cuts of red meat, fried  foods, and processed meats, such as sausage and lunch meat.   Eat lots of fresh fruits and vegetables. Choose whole grains, beans, pasta, potatoes and cereals.   Use only small amounts of olive, corn or canola oils. Avoid butter, mayonnaise, shortening or palm kernel oils. Avoid foods with trans-fats.   Use skim/nonfat milk and low-fat/nonfat yogurt and cheeses. Avoid whole milk, cream, ice cream, egg yolks and cheeses. Healthy desserts include angel food cake, gingersnaps, animal crackers, hard candy, popsicles, and low-fat/nonfat frozen yogurt. Avoid pastries, cakes, pies and cookies.   Exercise.   A regular program helps decrease LDL and raises HDL.   Helps with weight control.   Do things that increase your activity level like gardening, walking, or taking the stairs.   Medication.   May be prescribed by your caregiver to help lowering cholesterol and the risk for heart disease.   You may need medicine even if your levels are normal if you have several risk factors.  HOME CARE INSTRUCTIONS    Follow your diet and exercise programs as suggested by your caregiver.   Take medications as directed.   Have blood work done when your caregiver feels it is necessary.  MAKE SURE YOU:    Understand these instructions.   Will watch your condition.   Will get help right away if you are not doing well or get worse.  Document  Released: 06/12/2005 Document Revised: 09/04/2011 Document Reviewed: 11/28/2006 [facility] Patient Information 2013 [place], [place].

## 2012-05-09 NOTE — Progress Notes (Deleted)
  Subjective:    Patient ID: Tonya Cherry, female    DOB: 1950-07-19, 61 y.o.   MRN: [id number]  HPI    Review of Systems     Objective:   Physical Exam        Assessment & Plan:

## 2012-05-09 NOTE — Progress Notes (Signed)
Patient ID: Tonya Cherry, female   DOB: [birth year], 61 y.o.   MRN: [id number]  Subjective:    Patient ID: Tonya Cherry, female    DOB: 09-09-1950, 61 y.o.   MRN: [id number]  Hypertension Pertinent negatives include no chest pain, headaches, palpitations or shortness of breath.  Hyperlipidemia Pertinent negatives include no chest pain or shortness of breath.  61 year-old patient who is seen today for a health examination. Her husband is followed at this practice. She has a history of mild dyslipidemia and long history of anxiety disorder with panic attacks she has been on twice daily alprazolam for a number of years. She was diagnosed with hypothyroidism in the late 80s and is also on supplemental Synthroid. She has gastro-a soft reflux disease. She has a history of ongoing tobacco use past surgical history is fairly unremarkable she is gravida 4 para 3 abortus 1 she has had a C-section in 72. No other surgeries. Medical regimen includes Synthroid and Xanax and daily aspirin. She has been on Prilosec in the past. She also has a history of the foot eczema and has been on the high potency steroids.  Family history her father died at 39 complications of prostate cancer and history of EtOH. Mother age 35 has dementia also remote history of coronary artery disease and cerebrovascular disease. 3 brothers one died at age 13 of melanoma one sister with hypertension  Social history she is married 3 children 4 grandchildren works as an Advertising account planner he is a one pack per day smoker  Past Medical History  Diagnosis Date  . Depression   . GERD (gastroesophageal reflux disease)   . Ulcer   . Hyperlipidemia   . Thyroid disease     History   Social History  . Marital Status: Married    Spouse Name: N/A    Number of Children: N/A  . Years of Education: N/A   Occupational History  . Not on file.   Social History Main Topics  . Smoking status: Current Every Day Smoker -- 1.0 packs/day   Types: Cigarettes  . Smokeless tobacco: Never Used  . Alcohol Use: No  . Drug Use: No  . Sexually Active: Not on file   Other Topics Concern  . Not on file   Social History Narrative  . No narrative on file    Past Surgical History  Procedure Date  . Cesarean section     Family History  Problem Relation Age of Onset  . Heart disease Mother   . Stroke Mother   . Alcohol abuse Father   . Cancer Maternal Grandmother     colon    Allergies  Allergen Reactions  . Penicillins Cross Reactors Rash  . Sulfa Antibiotics Rash    Current Outpatient Prescriptions on File Prior to Visit  Medication Sig Dispense Refill  . ALPRAZolam (XANAX) 0.25 MG tablet 1 tablet 3 times daily as needed  90 tablet  4  . aspirin 81 MG tablet Take 81 mg by mouth daily.        Marland Kitchen levothyroxine (SYNTHROID) 75 MCG tablet Take 1 tablet (75 mcg total) by mouth daily.  90 tablet  4  . omeprazole (PRILOSEC) 20 MG capsule Take 1 capsule (20 mg total) by mouth daily.  90 capsule  4    BP 128/80  Pulse 84  Temp 98.3 F (36.8 C) (Oral)  Resp 18  Wt 156 lb (70.761 kg)  SpO2 97%  Review of Systems  Constitutional: Negative for fever, appetite change, fatigue and unexpected weight change.  HENT: Negative for hearing loss, ear pain, nosebleeds, congestion, sore throat, mouth sores, trouble swallowing, neck stiffness, dental problem, voice change, sinus pressure and tinnitus.   Eyes: Negative for photophobia, pain, redness and visual disturbance.  Respiratory: Negative for cough, chest tightness and shortness of breath.   Cardiovascular: Negative for chest pain, palpitations and leg swelling.  Gastrointestinal: Negative for nausea, vomiting, abdominal pain, diarrhea, constipation, blood in stool, abdominal distention and rectal pain.  Genitourinary: Negative for dysuria, urgency, frequency, hematuria, flank pain, vaginal bleeding, vaginal discharge, difficulty urinating, genital sores, vaginal pain,  menstrual problem and pelvic pain.  Musculoskeletal: Negative for back pain and arthralgias.  Skin: Positive for rash.  Neurological: Negative for dizziness, syncope, speech difficulty, weakness, light-headedness, numbness and headaches.  Hematological: Negative for adenopathy. Does not bruise/bleed easily.  Psychiatric/Behavioral: Negative for suicidal ideas, behavioral problems, self-injury, dysphoric mood and agitation. The patient is not nervous/anxious.        Objective:   Physical Exam  Constitutional: She is oriented to person, place, and time. She appears well-developed and well-nourished.  HENT:  Head: Normocephalic and atraumatic.  Right Ear: External ear normal.  Left Ear: External ear normal.  Mouth/Throat: Oropharynx is clear and moist.  Eyes: Conjunctivae normal and EOM are normal.  Neck: Normal range of motion. Neck supple. No JVD present. No thyromegaly present.       Decreased left carotid upstroke without bruit  Cardiovascular: Normal rate, regular rhythm, normal heart sounds and intact distal pulses.   No murmur heard.      Dorsalis pedis pulses not easily palpable  Pulmonary/Chest: Effort normal and breath sounds normal. She has no wheezes. She has no rales.  Abdominal: Soft. Bowel sounds are normal. She exhibits no distension and no mass. There is no tenderness. There is no rebound and no guarding.  Musculoskeletal: Normal range of motion. She exhibits no edema and no tenderness.  Neurological: She is alert and oriented to person, place, and time. She has normal reflexes. No cranial nerve deficit. She exhibits normal muscle tone. Coordination normal.  Skin: Skin is warm and dry. Rash noted.       Thick dry scaly rash involving the plantar aspects of the feet  Psychiatric: She has a normal mood and affect. Her behavior is normal.          Assessment & Plan:   Anxiety disorder Tobacco abuse History of gastroesophageal reflux disease  Eczema of the  feet Hypothyroidism Dyslipidemia- patient has significant hypercholesterolemia with a LDL cholesterol in excess of 200. Statin therapy discussed at length and she is very resistant to therapy at this time. She wishes to try lifestyle and will recheck in 3 months   Screening colonoscopy Screening carotid artery Doppler

## 2012-05-13 ENCOUNTER — Encounter (INDEPENDENT_AMBULATORY_CARE_PROVIDER_SITE_OTHER): Payer: BC Managed Care – PPO

## 2012-05-13 DIAGNOSIS — I6529 Occlusion and stenosis of unspecified carotid artery: Secondary | ICD-10-CM

## 2012-05-13 DIAGNOSIS — R0989 Other specified symptoms and signs involving the circulatory and respiratory systems: Secondary | ICD-10-CM

## 2012-05-16 ENCOUNTER — Encounter: Payer: Self-pay | Admitting: Internal Medicine

## 2012-05-16 ENCOUNTER — Other Ambulatory Visit: Payer: Self-pay | Admitting: *Deleted

## 2012-05-16 MED ORDER — ATORVASTATIN CALCIUM 40 MG PO TABS: 40.0000 mg | ORAL_TABLET | Freq: Every day | ORAL | Status: DC

## 2012-05-17 ENCOUNTER — Encounter: Payer: Self-pay | Admitting: Internal Medicine

## 2012-05-17 ENCOUNTER — Telehealth: Payer: Self-pay | Admitting: *Deleted

## 2012-05-17 NOTE — Telephone Encounter (Signed)
Spoke to pt told her I am unable to mail report to pt due to not entered in chart yet. Told pt she can review the report in My Chart once it is entered. Pt verbalized understanding.

## 2012-05-20 ENCOUNTER — Ambulatory Visit (INDEPENDENT_AMBULATORY_CARE_PROVIDER_SITE_OTHER): Payer: BC Managed Care – PPO | Admitting: Internal Medicine

## 2012-05-20 ENCOUNTER — Encounter: Payer: Self-pay | Admitting: Internal Medicine

## 2012-05-20 VITALS — BP 126/80 | HR 76 | Temp 98.2°F | Resp 16 | Wt 153.0 lb

## 2012-05-20 DIAGNOSIS — I6529 Occlusion and stenosis of unspecified carotid artery: Secondary | ICD-10-CM | POA: Insufficient documentation

## 2012-05-20 NOTE — Patient Instructions (Addendum)
Limit your sodium (Salt) intake    It is important that you exercise regularly, at least 20 minutes 3 to 4 times per week.  If you develop chest pain or shortness of breath seek  medical attention.  Return in 3 months for follow-up  Smoking tobacco is very bad for your health. You should stop smoking immediately.

## 2012-05-20 NOTE — Progress Notes (Signed)
Subjective:    Patient ID: Tonya Cherry, female    DOB: 12-Feb-1951, 61 y.o.   MRN: [id number]  HPI  61 year old patient who is seen today to discuss recent laboratory testing. A recent carotid duplex study revealed moderate to severe left carotid artery stenosis. She has multiple vascular risk factors including severe hypercholesterolemia ongoing tobacco use and impaired glucose tolerance. She has been very reluctant to initiate statin therapy. The risk and benefit of this medication discussed at length. General risk factor modification discussed at length  Past Medical History  Diagnosis Date  . Depression   . GERD (gastroesophageal reflux disease)   . Ulcer   . Hyperlipidemia   . Thyroid disease     History   Social History  . Marital Status: Married    Spouse Name: N/A    Number of Children: N/A  . Years of Education: N/A   Occupational History  . Not on file.   Social History Main Topics  . Smoking status: Current Every Day Smoker -- 1.0 packs/day    Types: Cigarettes  . Smokeless tobacco: Never Used  . Alcohol Use: No  . Drug Use: No  . Sexually Active: Not on file   Other Topics Concern  . Not on file   Social History Narrative  . No narrative on file    Past Surgical History  Procedure Date  . Cesarean section     Family History  Problem Relation Age of Onset  . Heart disease Mother   . Stroke Mother   . Alcohol abuse Father   . Cancer Maternal Grandmother     colon    Allergies  Allergen Reactions  . Penicillins Cross Reactors Rash  . Sulfa Antibiotics Rash    Current Outpatient Prescriptions on File Prior to Visit  Medication Sig Dispense Refill  . ALPRAZolam (XANAX) 0.25 MG tablet 1 tablet 3 times daily as needed  90 tablet  4  . aspirin 81 MG tablet Take 81 mg by mouth daily.        Marland Kitchen levothyroxine (SYNTHROID) 75 MCG tablet Take 1 tablet (75 mcg total) by mouth daily.  90 tablet  4  . omeprazole (PRILOSEC) 20 MG capsule Take 1 capsule  (20 mg total) by mouth daily.  90 capsule  4  . atorvastatin (LIPITOR) 40 MG tablet Take 1 tablet (40 mg total) by mouth daily.  90 tablet  2    BP 126/80  Pulse 76  Temp 98.2 F (36.8 C) (Oral)  Resp 16  Wt 153 lb (69.4 kg)       Review of Systems  Constitutional: Negative.   HENT: Negative for hearing loss, congestion, sore throat, rhinorrhea, dental problem, sinus pressure and tinnitus.   Eyes: Negative for pain, discharge and visual disturbance.  Respiratory: Negative for cough and shortness of breath.   Cardiovascular: Negative for chest pain, palpitations and leg swelling.  Gastrointestinal: Negative for nausea, vomiting, abdominal pain, diarrhea, constipation, blood in stool and abdominal distention.  Genitourinary: Negative for dysuria, urgency, frequency, hematuria, flank pain, vaginal bleeding, vaginal discharge, difficulty urinating, vaginal pain and pelvic pain.  Musculoskeletal: Negative for joint swelling, arthralgias and gait problem.  Skin: Negative for rash.  Neurological: Negative for dizziness, syncope, speech difficulty, weakness, numbness and headaches.  Hematological: Negative for adenopathy.  Psychiatric/Behavioral: Negative for behavioral problems, dysphoric mood and agitation. The patient is not nervous/anxious.        Objective:   Physical Exam  Constitutional: She appears well-developed and  well-nourished. No distress.       Anxious blood pressure 126/80           Assessment & Plan:   Left carotid artery stenosis. We'll followup a duplex Doppler study in 6 months. Aggressive risk factor modification encouraged. Daily aspirin use smoking cessation and regular exercise all discussed Hypercholesterolemia. The patient has agreed to start atorvastatin 40 mg daily. We'll check a lipid profile in 3 months

## 2012-05-27 ENCOUNTER — Encounter: Payer: Self-pay | Admitting: Internal Medicine

## 2012-06-12 ENCOUNTER — Encounter: Payer: Self-pay | Admitting: Internal Medicine

## 2012-07-11 ENCOUNTER — Encounter: Payer: Self-pay | Admitting: Internal Medicine

## 2012-08-10 ENCOUNTER — Other Ambulatory Visit: Payer: Self-pay

## 2012-08-16 ENCOUNTER — Other Ambulatory Visit (INDEPENDENT_AMBULATORY_CARE_PROVIDER_SITE_OTHER): Payer: BC Managed Care – PPO

## 2012-08-16 DIAGNOSIS — E785 Hyperlipidemia, unspecified: Secondary | ICD-10-CM

## 2012-08-16 LAB — LIPID PANEL
Cholesterol: 151 mg/dL (ref 0–200)
HDL: 51.7 mg/dL (ref >39.00)
LDL Cholesterol: 80 mg/dL (ref 0–99)
VLDL: 19.2 mg/dL (ref 0.0–40.0)

## 2012-08-20 ENCOUNTER — Encounter: Payer: Self-pay | Admitting: Internal Medicine

## 2012-08-20 ENCOUNTER — Ambulatory Visit (INDEPENDENT_AMBULATORY_CARE_PROVIDER_SITE_OTHER): Payer: BC Managed Care – PPO | Admitting: Internal Medicine

## 2012-08-20 VITALS — BP 110/70 | HR 102 | Temp 98.6°F | Resp 18 | Wt 152.0 lb

## 2012-08-20 DIAGNOSIS — Z720 Tobacco use: Secondary | ICD-10-CM

## 2012-08-20 DIAGNOSIS — E785 Hyperlipidemia, unspecified: Secondary | ICD-10-CM

## 2012-08-20 DIAGNOSIS — F17200 Nicotine dependence, unspecified, uncomplicated: Secondary | ICD-10-CM

## 2012-08-20 DIAGNOSIS — I6523 Occlusion and stenosis of bilateral carotid arteries: Secondary | ICD-10-CM

## 2012-08-20 DIAGNOSIS — I6529 Occlusion and stenosis of unspecified carotid artery: Secondary | ICD-10-CM

## 2012-08-20 MED ORDER — OMEPRAZOLE 20 MG PO CPDR: 20.0000 mg | DELAYED_RELEASE_CAPSULE | Freq: Every day | ORAL | Status: DC

## 2012-08-20 MED ORDER — ATORVASTATIN CALCIUM 40 MG PO TABS: 40.0000 mg | ORAL_TABLET | Freq: Every day | ORAL | Status: DC

## 2012-08-20 MED ORDER — LEVOTHYROXINE SODIUM 75 MCG PO TABS: 75.0000 ug | ORAL_TABLET | Freq: Every day | ORAL | Status: DC

## 2012-08-20 MED ORDER — ALPRAZOLAM 0.25 MG PO TABS: ORAL_TABLET | ORAL | Status: DC

## 2012-08-20 NOTE — Patient Instructions (Signed)
Smoking tobacco is very bad for your health. You should stop smoking immediately.    It is important that you exercise regularly, at least 20 minutes 3 to 4 times per week.  If you develop chest pain or shortness of breath seek  medical attention.  Return in 3 months for follow-up

## 2012-08-20 NOTE — Progress Notes (Signed)
  Subjective:    Patient ID: Tonya Cherry, female    DOB: Dec 04, 1950, 62 y.o.   MRN: [id number]  HPI  62 year old patient who is seen today for followup. She has dyslipidemia and bilateral moderate carotid artery stenosis. She now is on atorvastatin which he has tolerated well. Her lipid profile reviewed. She has a history of tobacco use and has cut her tobacco consumption considerably. Denies any focal neurological symptoms    Review of Systems  Constitutional: Negative.   HENT: Negative for hearing loss, congestion, sore throat, rhinorrhea, dental problem, sinus pressure and tinnitus.   Eyes: Negative for pain, discharge and visual disturbance.  Respiratory: Negative for cough and shortness of breath.   Cardiovascular: Negative for chest pain, palpitations and leg swelling.  Gastrointestinal: Negative for nausea, vomiting, abdominal pain, diarrhea, constipation, blood in stool and abdominal distention.  Genitourinary: Negative for dysuria, urgency, frequency, hematuria, flank pain, vaginal bleeding, vaginal discharge, difficulty urinating, vaginal pain and pelvic pain.  Musculoskeletal: Negative for joint swelling, arthralgias and gait problem.  Skin: Negative for rash.  Neurological: Negative for dizziness, syncope, speech difficulty, weakness, numbness and headaches.  Hematological: Negative for adenopathy.  Psychiatric/Behavioral: Negative for behavioral problems, dysphoric mood and agitation. The patient is not nervous/anxious.        Objective:   Physical Exam  Constitutional: She appears well-nourished. No distress.  Neck:  Decreased left carotid upstroke without bruits          Assessment & Plan:   Dyslipidemia. Greatly improved. Continue atorvastatin 40 Tobacco abuse. Total smoking cessation encouraged Carotid artery disease stable we'll recheck carotid artery Doppler studies in 3 months  Lifestyle issues discussed

## 2012-11-21 ENCOUNTER — Ambulatory Visit (INDEPENDENT_AMBULATORY_CARE_PROVIDER_SITE_OTHER): Payer: BC Managed Care – PPO | Admitting: Internal Medicine

## 2012-11-21 ENCOUNTER — Encounter: Payer: Self-pay | Admitting: Internal Medicine

## 2012-11-21 VITALS — BP 130/82 | HR 86 | Temp 98.1°F | Resp 20 | Wt 152.0 lb

## 2012-11-21 DIAGNOSIS — I658 Occlusion and stenosis of other precerebral arteries: Secondary | ICD-10-CM

## 2012-11-21 DIAGNOSIS — I6523 Occlusion and stenosis of bilateral carotid arteries: Secondary | ICD-10-CM

## 2012-11-21 DIAGNOSIS — E785 Hyperlipidemia, unspecified: Secondary | ICD-10-CM

## 2012-11-21 DIAGNOSIS — I6529 Occlusion and stenosis of unspecified carotid artery: Secondary | ICD-10-CM

## 2012-11-21 MED ORDER — ALPRAZOLAM 0.25 MG PO TABS: ORAL_TABLET | ORAL | Status: DC

## 2012-11-21 NOTE — Progress Notes (Signed)
Subjective:    Patient ID: Tonya Cherry, female    DOB: 1951-03-04, 62 y.o.   MRN: [id number]  HPI  62 year old patient who is seen today in followup. She has a history of moderate bilateral carotid artery stenosis. Other medical issues include dyslipidemia. She is now on statin therapy which he continues to tolerate. She has a history of ongoing tobacco use. There has been considerable situational stress due to the poor health of her husband who is being treated for lung cancer. Denies any focal neurological symptoms  Past Medical History  Diagnosis Date  . Depression   . GERD (gastroesophageal reflux disease)   . Ulcer   . Hyperlipidemia   . Thyroid disease     History   Social History  . Marital Status: Married    Spouse Name: N/A    Number of Children: N/A  . Years of Education: N/A   Occupational History  . Not on file.   Social History Main Topics  . Smoking status: Current Every Day Smoker -- 1.00 packs/day    Types: Cigarettes  . Smokeless tobacco: Never Used  . Alcohol Use: No  . Drug Use: No  . Sexually Active: Not on file   Other Topics Concern  . Not on file   Social History Narrative  . No narrative on file    Past Surgical History  Procedure Laterality Date  . Cesarean section      Family History  Problem Relation Age of Onset  . Heart disease Mother   . Stroke Mother   . Alcohol abuse Father   . Cancer Maternal Grandmother     colon    Allergies  Allergen Reactions  . Penicillins Cross Reactors Rash  . Sulfa Antibiotics Rash    Current Outpatient Prescriptions on File Prior to Visit  Medication Sig Dispense Refill  . ALPRAZolam (XANAX) 0.25 MG tablet 1 tablet 3 times daily as needed  90 tablet  4  . aspirin 81 MG tablet Take 81 mg by mouth daily.        Marland Kitchen atorvastatin (LIPITOR) 40 MG tablet Take 1 tablet (40 mg total) by mouth daily.  90 tablet  2  . levothyroxine (SYNTHROID) 75 MCG tablet Take 1 tablet (75 mcg total) by mouth  daily.  90 tablet  4  . omeprazole (PRILOSEC) 20 MG capsule Take 1 capsule (20 mg total) by mouth daily.  90 capsule  4   No current facility-administered medications on file prior to visit.    BP 130/82  Pulse 86  Temp(Src) 98.1 F (36.7 C) (Oral)  Resp 20  Wt 152 lb (68.947 kg)  BMI 26.5 kg/m2  SpO2 98%       Review of Systems  Constitutional: Negative.   HENT: Negative for hearing loss, congestion, sore throat, rhinorrhea, dental problem, sinus pressure and tinnitus.   Eyes: Negative for pain, discharge and visual disturbance.  Respiratory: Negative for cough and shortness of breath.   Cardiovascular: Negative for chest pain, palpitations and leg swelling.  Gastrointestinal: Negative for nausea, vomiting, abdominal pain, diarrhea, constipation, blood in stool and abdominal distention.  Genitourinary: Negative for dysuria, urgency, frequency, hematuria, flank pain, vaginal bleeding, vaginal discharge, difficulty urinating, vaginal pain and pelvic pain.  Musculoskeletal: Negative for joint swelling, arthralgias and gait problem.  Skin: Negative for rash.  Neurological: Negative for dizziness, syncope, speech difficulty, weakness, numbness and headaches.  Hematological: Negative for adenopathy.  Psychiatric/Behavioral: Negative for behavioral problems, dysphoric mood and agitation. The  patient is nervous/anxious.        Objective:   Physical Exam  Constitutional: She is oriented to person, place, and time. She appears well-developed and well-nourished.  Repeat blood pressure 120/76  HENT:  Head: Normocephalic.  Right Ear: External ear normal.  Left Ear: External ear normal.  Mouth/Throat: Oropharynx is clear and moist.  Eyes: Conjunctivae and EOM are normal. Pupils are equal, round, and reactive to light.  Neck: Normal range of motion. Neck supple. No thyromegaly present.  Perhaps slight decreased left carotid upstroke good no bruits  Cardiovascular: Normal rate,  regular rhythm, normal heart sounds and intact distal pulses.   Pulmonary/Chest: Effort normal and breath sounds normal.  Abdominal: Soft. Bowel sounds are normal. She exhibits no mass. There is no tenderness.  Musculoskeletal: Normal range of motion.  Lymphadenopathy:    She has no cervical adenopathy.  Neurological: She is alert and oriented to person, place, and time.  Skin: Skin is warm and dry. No rash noted.  Psychiatric: She has a normal mood and affect. Her behavior is normal.          Assessment & Plan:  Dyslipidemia. Continue atorvastatin Bilateral moderate carotid artery stenosis. We'll check a follow carotid artery ultrasound Tobacco use total cessation encouraged Recheck 6 months

## 2012-11-21 NOTE — Patient Instructions (Signed)
Limit your sodium (Salt) intake  Smoking tobacco is very bad for your health. You should stop smoking immediately.  Return in 6 months for follow-up   

## 2012-11-26 ENCOUNTER — Encounter (INDEPENDENT_AMBULATORY_CARE_PROVIDER_SITE_OTHER): Payer: BC Managed Care – PPO

## 2012-11-26 DIAGNOSIS — I6523 Occlusion and stenosis of bilateral carotid arteries: Secondary | ICD-10-CM

## 2012-11-26 DIAGNOSIS — I6529 Occlusion and stenosis of unspecified carotid artery: Secondary | ICD-10-CM

## 2013-03-27 ENCOUNTER — Other Ambulatory Visit: Payer: Self-pay | Admitting: Internal Medicine

## 2013-04-03 ENCOUNTER — Telehealth: Payer: Self-pay | Admitting: Internal Medicine

## 2013-04-03 ENCOUNTER — Ambulatory Visit (INDEPENDENT_AMBULATORY_CARE_PROVIDER_SITE_OTHER): Payer: BC Managed Care – PPO | Admitting: Internal Medicine

## 2013-04-03 ENCOUNTER — Encounter: Payer: Self-pay | Admitting: Internal Medicine

## 2013-04-03 VITALS — BP 138/80 | HR 142 | Temp 98.5°F | Wt 154.0 lb

## 2013-04-03 DIAGNOSIS — E785 Hyperlipidemia, unspecified: Secondary | ICD-10-CM

## 2013-04-03 DIAGNOSIS — R208 Other disturbances of skin sensation: Secondary | ICD-10-CM

## 2013-04-03 DIAGNOSIS — I6523 Occlusion and stenosis of bilateral carotid arteries: Secondary | ICD-10-CM

## 2013-04-03 DIAGNOSIS — I658 Occlusion and stenosis of other precerebral arteries: Secondary | ICD-10-CM

## 2013-04-03 DIAGNOSIS — Z720 Tobacco use: Secondary | ICD-10-CM

## 2013-04-03 DIAGNOSIS — R209 Unspecified disturbances of skin sensation: Secondary | ICD-10-CM

## 2013-04-03 DIAGNOSIS — I6529 Occlusion and stenosis of unspecified carotid artery: Secondary | ICD-10-CM

## 2013-04-03 NOTE — Telephone Encounter (Signed)
noted 

## 2013-04-03 NOTE — Telephone Encounter (Signed)
Patient Information:  Caller Name: Tonya Cherry  Phone: [phone]  Patient: Tonya Cherry, Tonya Cherry  Gender: Female  DOB: 1950/09/13  Age: 62 Years  PCP: Eleonore Chiquito (Family Practice > 40yrs old)  Office Follow Up:  Does the office need to follow up with this patient?: No  Instructions For The Office: N/A  RN Note:  Left Arm "feels like hot water rushing thru it", Pt denies warm to touch or pain.  Pt stopped Lipitor in April 2014. Pt states sensation comes and goes, denies sxs at time of call. Pt concerned of fibromyglia.  Pt has appt at 1600 on 10-9.  Symptoms  Reason For Call & Symptoms: Left Arm "feels like hot water rushing thru it", Pt denies warm to touch, pain or injuries.  Pt stopped Lipitor in April 2014.  Reviewed Health History In EMR: Yes  Reviewed Medications In EMR: Yes  Reviewed Allergies In EMR: Yes  Reviewed Surgeries / Procedures: Yes  Date of Onset of Symptoms: 03/29/2013  Guideline(s) Used:  Arm Pain  Disposition Per Guideline:   See Today or Tomorrow in Office  Reason For Disposition Reached:   Patient wants to be seen  Advice Given:  N/A  Patient Will Follow Care Advice:  YES

## 2013-04-03 NOTE — Progress Notes (Signed)
Subjective:    Patient ID: Tonya Cherry, female    DOB: 15-Dec-1950, 62 y.o.   MRN: [id number]  HPI  62 year old patient who is seen today for followup. Medical problems include moderate carotid artery stenosis dyslipidemia and ongoing tobacco use. She has been under considerable situational stress due to the poor health of her husband who has vascular disease as well his stage IV lung cancer. Over the past year she has had 4-5 episodes of dysesthesias involving her left upper inner arm. This is described as a fleeting hot sensation described as hot liquid flowing through her arm. The discomfort lasts just a few seconds. There does not seem to be any precipitating factors.  She has been on statin therapy and that she self discontinued due to safety concerns  Past Medical History  Diagnosis Date  . Depression   . GERD (gastroesophageal reflux disease)   . Ulcer   . Hyperlipidemia   . Thyroid disease     History   Social History  . Marital Status: Married    Spouse Name: N/A    Number of Children: N/A  . Years of Education: N/A   Occupational History  . Not on file.   Social History Main Topics  . Smoking status: Current Every Day Smoker -- 1.00 packs/day    Types: Cigarettes  . Smokeless tobacco: Never Used  . Alcohol Use: No  . Drug Use: No  . Sexual Activity: Not on file   Other Topics Concern  . Not on file   Social History Narrative  . No narrative on file    Past Surgical History  Procedure Laterality Date  . Cesarean section      Family History  Problem Relation Age of Onset  . Heart disease Mother   . Stroke Mother   . Alcohol abuse Father   . Cancer Maternal Grandmother     colon    Allergies  Allergen Reactions  . Penicillins Cross Reactors Rash  . Sulfa Antibiotics Rash    Current Outpatient Prescriptions on File Prior to Visit  Medication Sig Dispense Refill  . ALPRAZolam (XANAX) 0.25 MG tablet 1 tablet 3 times daily as needed  90 tablet   4  . aspirin 81 MG tablet Take 81 mg by mouth daily.        Marland Kitchen atorvastatin (LIPITOR) 40 MG tablet Take 1 tablet (40 mg total) by mouth daily.  90 tablet  2  . levothyroxine (SYNTHROID) 75 MCG tablet Take 1 tablet (75 mcg total) by mouth daily.  90 tablet  4  . omeprazole (PRILOSEC) 20 MG capsule TAKE ONE CAPSULE BY MOUTH EVERY DAY  90 capsule  3   No current facility-administered medications on file prior to visit.    BP 138/80  Pulse 142  Temp(Src) 98.5 F (36.9 C) (Oral)  Wt 154 lb (69.854 kg)  BMI 26.85 kg/m2  SpO2 97%       Review of Systems  Constitutional: Negative.   HENT: Negative for congestion, dental problem, hearing loss, rhinorrhea, sinus pressure, sore throat and tinnitus.   Eyes: Negative for pain, discharge and visual disturbance.  Respiratory: Negative for cough and shortness of breath.   Cardiovascular: Negative for chest pain, palpitations and leg swelling.  Gastrointestinal: Negative for nausea, vomiting, abdominal pain, diarrhea, constipation, blood in stool and abdominal distention.  Genitourinary: Negative for dysuria, urgency, frequency, hematuria, flank pain, vaginal bleeding, vaginal discharge, difficulty urinating, vaginal pain and pelvic pain.  Musculoskeletal: Negative for arthralgias,  gait problem and joint swelling.  Skin: Negative for rash.  Neurological: Positive for numbness. Negative for dizziness, syncope, speech difficulty, weakness and headaches.  Hematological: Negative for adenopathy.  Psychiatric/Behavioral: Negative for behavioral problems, dysphoric mood and agitation. The patient is not nervous/anxious.        Objective:   Physical Exam  Constitutional: She is oriented to person, place, and time. She appears well-developed and well-nourished. No distress.  Repeat blood pressure 120/70  Musculoskeletal: Normal range of motion. She exhibits no edema and no tenderness.  Neurological: She is alert and oriented to person, place, and  time. She has normal reflexes. No cranial nerve deficit. Coordination normal.  Left arm unremarkable Pulses intact Reflexes brisk and symmetrical  Grip strength normal Arm flexion and extension normal          Assessment & Plan:   Paroxysmal left upper arm dysesthesias. Unclear etiology. Unremarkable clinical exam Dyslipidemia Carotid artery stenosis  The risk versus benefits of statin therapy discussed at length;  she is mainly concerned about risk of diabetes. She is agreeable to resuming therapy. She does have a preventive health examination scheduled next month. We'll reassess at that time

## 2013-04-03 NOTE — Patient Instructions (Signed)
Resume Lipitor therapy Return office visit next month as scheduled Continue daily aspirin  Report any clinical change

## 2013-05-01 ENCOUNTER — Other Ambulatory Visit: Payer: Self-pay

## 2013-05-09 ENCOUNTER — Encounter: Payer: Self-pay | Admitting: *Deleted

## 2013-05-11 ENCOUNTER — Other Ambulatory Visit: Payer: Self-pay | Admitting: Internal Medicine

## 2013-05-12 ENCOUNTER — Other Ambulatory Visit (INDEPENDENT_AMBULATORY_CARE_PROVIDER_SITE_OTHER): Payer: BC Managed Care – PPO

## 2013-05-12 DIAGNOSIS — Z0000 Encounter for general adult medical examination without abnormal findings: Secondary | ICD-10-CM

## 2013-05-12 LAB — TSH: TSH: 1.65 u[IU]/mL (ref 0.35–5.50)

## 2013-05-12 LAB — POCT URINALYSIS DIPSTICK
Bilirubin, UA: NEGATIVE
Glucose, UA: NEGATIVE
Ketones, UA: NEGATIVE
Nitrite, UA: NEGATIVE

## 2013-05-12 LAB — CBC WITH DIFFERENTIAL/PLATELET
Basophils Absolute: 0 10*3/uL (ref 0.0–0.1)
Eosinophils Relative: 1.5 % (ref 0.0–5.0)
Lymphocytes Relative: 34.1 % (ref 12.0–46.0)
Lymphs Abs: 2.4 10*3/uL (ref 0.7–4.0)
Monocytes Relative: 6.8 % (ref 3.0–12.0)
Neutrophils Relative %: 57.1 % (ref 43.0–77.0)
Platelets: 279 10*3/uL (ref 150.0–400.0)
RDW: 13.4 % (ref 11.5–14.6)
WBC: 7.2 10*3/uL (ref 4.5–10.5)

## 2013-05-12 LAB — LIPID PANEL
HDL: 59.1 mg/dL (ref >39.00)
Total CHOL/HDL Ratio: 3
Triglycerides: 116 mg/dL (ref 0.0–149.0)
VLDL: 23.2 mg/dL (ref 0.0–40.0)

## 2013-05-12 LAB — HEPATIC FUNCTION PANEL
AST: 22 U/L (ref 0–37)
Alkaline Phosphatase: 79 U/L (ref 39–117)
Bilirubin, Direct: 0.1 mg/dL (ref 0.0–0.3)
Total Bilirubin: 0.5 mg/dL (ref 0.3–1.2)

## 2013-05-12 LAB — BASIC METABOLIC PANEL
BUN: 11 mg/dL (ref 6–23)
Calcium: 10.2 mg/dL (ref 8.4–10.5)
Creatinine, Ser: 0.8 mg/dL (ref 0.4–1.2)
GFR: 76.35 mL/min (ref >60.00)
Glucose, Bld: 86 mg/dL (ref 70–99)

## 2013-05-15 ENCOUNTER — Encounter: Payer: Self-pay | Admitting: Internal Medicine

## 2013-05-15 ENCOUNTER — Ambulatory Visit (INDEPENDENT_AMBULATORY_CARE_PROVIDER_SITE_OTHER): Payer: BC Managed Care – PPO | Admitting: Internal Medicine

## 2013-05-15 VITALS — BP 130/80 | HR 96 | Temp 98.3°F | Resp 20 | Ht 63.5 in | Wt 156.0 lb

## 2013-05-15 DIAGNOSIS — Z0000 Encounter for general adult medical examination without abnormal findings: Secondary | ICD-10-CM

## 2013-05-15 DIAGNOSIS — I6523 Occlusion and stenosis of bilateral carotid arteries: Secondary | ICD-10-CM

## 2013-05-15 DIAGNOSIS — Z720 Tobacco use: Secondary | ICD-10-CM

## 2013-05-15 DIAGNOSIS — E785 Hyperlipidemia, unspecified: Secondary | ICD-10-CM

## 2013-05-15 DIAGNOSIS — E039 Hypothyroidism, unspecified: Secondary | ICD-10-CM

## 2013-05-15 DIAGNOSIS — I6529 Occlusion and stenosis of unspecified carotid artery: Secondary | ICD-10-CM

## 2013-05-15 DIAGNOSIS — I658 Occlusion and stenosis of other precerebral arteries: Secondary | ICD-10-CM

## 2013-05-15 DIAGNOSIS — F17200 Nicotine dependence, unspecified, uncomplicated: Secondary | ICD-10-CM

## 2013-05-15 MED ORDER — SYNTHROID 75 MCG PO TABS: ORAL_TABLET | ORAL | Status: DC

## 2013-05-15 MED ORDER — ALPRAZOLAM 0.25 MG PO TABS: ORAL_TABLET | ORAL | Status: DC

## 2013-05-15 MED ORDER — ATORVASTATIN CALCIUM 40 MG PO TABS: 40.0000 mg | ORAL_TABLET | Freq: Every day | ORAL | Status: DC

## 2013-05-15 NOTE — Patient Instructions (Signed)
Limit your sodium (Salt) intake    It is important that you exercise regularly, at least 20 minutes 3 to 4 times per week.  If you develop chest pain or shortness of breath seek  medical attention.  Smoking tobacco is very bad for your health. You should stop smoking immediately.  Carotid artery Doppler ultrasound June 2015  Schedule your colonoscopy to help detect colon cancer.

## 2013-05-15 NOTE — Progress Notes (Signed)
Subjective:    Patient ID: Tonya Cherry, female    DOB: Aug 08, 1950, 62 y.o.   MRN: [id number]  HPI Pre-visit discussion using our clinic review tool. No additional management support is needed unless otherwise documented below in the visit note.  62 year old patient who is seen today for a wellness exam. Active medical problems include dyslipidemia ongoing tobacco use and hypothyroidism. She has moderate carotid artery stenosis which was stable in June of this year. No focal neurological complaints Laboratory studies reviewed Colonoscopy declined Flu vaccine  Other immunizations declined  Past Medical History  Diagnosis Date  . Depression   . GERD (gastroesophageal reflux disease)   . Ulcer   . Hyperlipidemia   . Thyroid disease     History   Social History  . Marital Status: Married    Spouse Name: N/A    Number of Children: N/A  . Years of Education: N/A   Occupational History  . Not on file.   Social History Main Topics  . Smoking status: Current Every Day Smoker -- 1.00 packs/day    Types: Cigarettes  . Smokeless tobacco: Never Used  . Alcohol Use: No  . Drug Use: No  . Sexual Activity: Not on file   Other Topics Concern  . Not on file   Social History Narrative  . No narrative on file    Past Surgical History  Procedure Laterality Date  . Cesarean section      Family History  Problem Relation Age of Onset  . Heart disease Mother   . Stroke Mother   . Alcohol abuse Father   . Cancer Maternal Grandmother     colon    Allergies  Allergen Reactions  . Penicillins Cross Reactors Rash  . Sulfa Antibiotics Rash    Current Outpatient Prescriptions on File Prior to Visit  Medication Sig Dispense Refill  . aspirin 81 MG tablet Take 81 mg by mouth daily.        Marland Kitchen omeprazole (PRILOSEC) 20 MG capsule TAKE ONE CAPSULE BY MOUTH EVERY DAY  90 capsule  3   No current facility-administered medications on file prior to visit.    BP 130/80  Pulse 96   Temp(Src) 98.3 F (36.8 C) (Oral)  Resp 20  Ht 5' 3.5" (1.613 m)  Wt 156 lb (70.761 kg)  BMI 27.20 kg/m2  SpO2 97%       Review of Systems  Constitutional: Negative.   HENT: Negative for congestion, dental problem, hearing loss, rhinorrhea, sinus pressure, sore throat and tinnitus.   Eyes: Negative for pain, discharge and visual disturbance.  Respiratory: Negative for cough and shortness of breath.   Cardiovascular: Positive for leg swelling. Negative for chest pain and palpitations.  Gastrointestinal: Negative for nausea, vomiting, abdominal pain, diarrhea, constipation, blood in stool and abdominal distention.  Genitourinary: Negative for dysuria, urgency, frequency, hematuria, flank pain, vaginal bleeding, vaginal discharge, difficulty urinating, vaginal pain and pelvic pain.  Musculoskeletal: Negative for arthralgias, gait problem and joint swelling.  Skin: Negative for rash.  Neurological: Negative for dizziness, syncope, speech difficulty, weakness, numbness and headaches.  Hematological: Negative for adenopathy.  Psychiatric/Behavioral: Negative for behavioral problems, dysphoric mood and agitation. The patient is not nervous/anxious.        Objective:   Physical Exam  Constitutional: She is oriented to person, place, and time. She appears well-developed and well-nourished.  HENT:  Head: Normocephalic and atraumatic.  Right Ear: External ear normal.  Left Ear: External ear normal.  Mouth/Throat: Oropharynx  is clear and moist.  Eyes: Conjunctivae and EOM are normal.  Neck: Normal range of motion. Neck supple. No JVD present. No thyromegaly present.  Decreased left carotid upstroke and no bruits noted  Cardiovascular: Normal rate, regular rhythm, normal heart sounds and intact distal pulses.   No murmur heard. Dorsalis pedis pulses diminished  Pulmonary/Chest: Effort normal and breath sounds normal. She has no wheezes. She has no rales.  Abdominal: Soft. Bowel sounds  are normal. She exhibits no distension and no mass. There is no tenderness. There is no rebound and no guarding.  Genitourinary: Vagina normal.  Musculoskeletal: Normal range of motion. She exhibits no edema and no tenderness.  Neurological: She is alert and oriented to person, place, and time. She has normal reflexes. No cranial nerve deficit. She exhibits normal muscle tone. Coordination normal.  Skin: Skin is warm and dry. No rash noted.  Marked dryness and flaking of the skin involving the soles of both feet  Psychiatric: She has a normal mood and affect. Her behavior is normal.          Assessment & Plan:   Preventive health examination Eczema of the feet Dyslipidemia. Continue atorvastatin Moderate carotid artery stenosis unchanged. Followup in 6 months. If stable consider followup every 12-24 months Tobacco abuse smoking cessation encouraged  Recheck one year Colonoscopy flu vaccine urged. She will consider

## 2013-05-30 ENCOUNTER — Encounter: Payer: Self-pay | Admitting: Internal Medicine

## 2013-06-24 ENCOUNTER — Ambulatory Visit (INDEPENDENT_AMBULATORY_CARE_PROVIDER_SITE_OTHER): Payer: BC Managed Care – PPO | Admitting: Internal Medicine

## 2013-06-24 ENCOUNTER — Encounter: Payer: Self-pay | Admitting: Internal Medicine

## 2013-06-24 VITALS — BP 122/80 | HR 101 | Temp 98.2°F | Resp 20 | Wt 153.0 lb

## 2013-06-24 DIAGNOSIS — S76219A Strain of adductor muscle, fascia and tendon of unspecified thigh, initial encounter: Secondary | ICD-10-CM

## 2013-06-24 DIAGNOSIS — IMO0002 Reserved for concepts with insufficient information to code with codable children: Secondary | ICD-10-CM

## 2013-06-24 NOTE — Progress Notes (Signed)
   Subjective:    Patient ID: Tonya Cherry, female    DOB: [birth year], 62 y.o.   MRN: [id number]  HPI   62 year old patient who presents with a 4 to five-day history of pain in the left groin area. This began a couple days following several games of bowling. No other trauma. Pain is worsened by movement but no discomfort with rest   Past Medical History  Diagnosis Date  . Depression   . GERD (gastroesophageal reflux disease)   . Ulcer   . Hyperlipidemia   . Thyroid disease     History   Social History  . Marital Status: Married    Spouse Name: N/A    Number of Children: N/A  . Years of Education: N/A   Occupational History  . Not on file.   Social History Main Topics  . Smoking status: Current Every Day Smoker -- 1.00 packs/day    Types: Cigarettes  . Smokeless tobacco: Never Used  . Alcohol Use: No  . Drug Use: No  . Sexual Activity: Not on file   Other Topics Concern  . Not on file   Social History Narrative  . No narrative on file    Past Surgical History  Procedure Laterality Date  . Cesarean section      Family History  Problem Relation Age of Onset  . Heart disease Mother   . Stroke Mother   . Alcohol abuse Father   . Cancer Maternal Grandmother     colon    Allergies  Allergen Reactions  . Penicillins Cross Reactors Rash  . Sulfa Antibiotics Rash    Current Outpatient Prescriptions on File Prior to Visit  Medication Sig Dispense Refill  . ALPRAZolam (XANAX) 0.25 MG tablet 1 tablet 3 times daily as needed  90 tablet  5  . aspirin 81 MG tablet Take 81 mg by mouth daily.        Marland Kitchen atorvastatin (LIPITOR) 40 MG tablet Take 1 tablet (40 mg total) by mouth daily.  90 tablet  3  . omeprazole (PRILOSEC) 20 MG capsule TAKE ONE CAPSULE BY MOUTH EVERY DAY  90 capsule  3  . SYNTHROID 75 MCG tablet TAKE 1 TABLET BY MOUTH DAILY (INS ONLY ALLOWS 30 DAY SUPPLY)  30 tablet  12   No current facility-administered medications on file prior to visit.    BP  122/80  Pulse 101  Temp(Src) 98.2 F (36.8 C) (Oral)  Resp 20  Wt 153 lb (69.4 kg)  SpO2 95%    Review of Systems  Musculoskeletal: Positive for myalgias.       Objective:   Physical Exam  Constitutional: She appears well-developed and well-nourished. No distress.  Musculoskeletal:  Mild tenderness to palpation in the left groin area. No hernia Flexion of the left hip as well as internal rotation does aggravate the pain           Assessment & Plan:  Left groin strain. We'll continue warm compresses Tylenol and rest. We'll call if unimproved

## 2013-06-24 NOTE — Patient Instructions (Signed)
You  may move around, but avoid painful motions and activities.  Apply  Heat  to the sore area for 15 to 20 minutes 3 or 4 times daily for the next two to 3 days. 

## 2013-06-24 NOTE — Progress Notes (Signed)
Pre-visit discussion using our clinic review tool. No additional management support is needed unless otherwise documented below in the visit note.  

## 2013-11-06 ENCOUNTER — Other Ambulatory Visit: Payer: Self-pay | Admitting: Internal Medicine

## 2014-02-19 ENCOUNTER — Other Ambulatory Visit (HOSPITAL_COMMUNITY): Payer: Self-pay | Admitting: Cardiology

## 2014-02-19 DIAGNOSIS — I6529 Occlusion and stenosis of unspecified carotid artery: Secondary | ICD-10-CM

## 2014-02-26 ENCOUNTER — Ambulatory Visit (HOSPITAL_COMMUNITY): Payer: BC Managed Care – PPO | Attending: Cardiology | Admitting: Cardiology

## 2014-02-26 DIAGNOSIS — I6529 Occlusion and stenosis of unspecified carotid artery: Secondary | ICD-10-CM

## 2014-02-26 DIAGNOSIS — E785 Hyperlipidemia, unspecified: Secondary | ICD-10-CM | POA: Diagnosis not present

## 2014-02-26 DIAGNOSIS — F17200 Nicotine dependence, unspecified, uncomplicated: Secondary | ICD-10-CM | POA: Insufficient documentation

## 2014-02-26 NOTE — Progress Notes (Signed)
Carotid duplex performed 

## 2014-03-12 ENCOUNTER — Emergency Department (HOSPITAL_BASED_OUTPATIENT_CLINIC_OR_DEPARTMENT_OTHER)
Admission: EM | Admit: 2014-03-12 | Discharge: 2014-03-12 | Disposition: A | Discharge status: Home / Self Care | Payer: BC Managed Care – PPO | Attending: Emergency Medicine | Admitting: Emergency Medicine

## 2014-03-12 ENCOUNTER — Emergency Department (HOSPITAL_BASED_OUTPATIENT_CLINIC_OR_DEPARTMENT_OTHER): Payer: BC Managed Care – PPO

## 2014-03-12 ENCOUNTER — Telehealth: Payer: Self-pay | Admitting: Internal Medicine

## 2014-03-12 DIAGNOSIS — K219 Gastro-esophageal reflux disease without esophagitis: Secondary | ICD-10-CM | POA: Insufficient documentation

## 2014-03-12 DIAGNOSIS — Z9889 Other specified postprocedural states: Secondary | ICD-10-CM | POA: Diagnosis not present

## 2014-03-12 DIAGNOSIS — F3289 Other specified depressive episodes: Secondary | ICD-10-CM | POA: Insufficient documentation

## 2014-03-12 DIAGNOSIS — F17200 Nicotine dependence, unspecified, uncomplicated: Secondary | ICD-10-CM | POA: Insufficient documentation

## 2014-03-12 DIAGNOSIS — E785 Hyperlipidemia, unspecified: Secondary | ICD-10-CM | POA: Insufficient documentation

## 2014-03-12 DIAGNOSIS — R1013 Epigastric pain: Secondary | ICD-10-CM | POA: Diagnosis present

## 2014-03-12 DIAGNOSIS — E079 Disorder of thyroid, unspecified: Secondary | ICD-10-CM | POA: Insufficient documentation

## 2014-03-12 DIAGNOSIS — Z880 Allergy status to penicillin: Secondary | ICD-10-CM | POA: Diagnosis not present

## 2014-03-12 DIAGNOSIS — Z872 Personal history of diseases of the skin and subcutaneous tissue: Secondary | ICD-10-CM | POA: Diagnosis not present

## 2014-03-12 DIAGNOSIS — F329 Major depressive disorder, single episode, unspecified: Secondary | ICD-10-CM | POA: Insufficient documentation

## 2014-03-12 DIAGNOSIS — Z79899 Other long term (current) drug therapy: Secondary | ICD-10-CM | POA: Insufficient documentation

## 2014-03-12 DIAGNOSIS — Z7982 Long term (current) use of aspirin: Secondary | ICD-10-CM | POA: Insufficient documentation

## 2014-03-12 LAB — COMPREHENSIVE METABOLIC PANEL
ALT: 15 U/L (ref 0–35)
AST: 21 U/L (ref 0–37)
Albumin: 4.2 g/dL (ref 3.5–5.2)
Alkaline Phosphatase: 96 U/L (ref 39–117)
Anion gap: 19 — ABNORMAL HIGH (ref 5–15)
BUN: 11 mg/dL (ref 6–23)
CALCIUM: 10.4 mg/dL (ref 8.4–10.5)
CO2: 21 meq/L (ref 19–32)
Chloride: 102 mEq/L (ref 96–112)
Creatinine, Ser: 0.7 mg/dL (ref 0.50–1.10)
GLUCOSE: 101 mg/dL — AB (ref 70–99)
POTASSIUM: 4.2 meq/L (ref 3.7–5.3)
Sodium: 142 mEq/L (ref 137–147)
Total Bilirubin: 0.3 mg/dL (ref 0.3–1.2)
Total Protein: 7.8 g/dL (ref 6.0–8.3)

## 2014-03-12 LAB — CBC WITH DIFFERENTIAL/PLATELET
BASOS ABS: 0 10*3/uL (ref 0.0–0.1)
Basophils Relative: 1 % (ref 0–1)
Eosinophils Absolute: 0.2 10*3/uL (ref 0.0–0.7)
Eosinophils Relative: 3 % (ref 0–5)
HCT: 43.5 % (ref 36.0–46.0)
Hemoglobin: 14.8 g/dL (ref 12.0–15.0)
LYMPHS ABS: 2.4 10*3/uL (ref 0.7–4.0)
LYMPHS PCT: 33 % (ref 12–46)
MCH: 31.6 pg (ref 26.0–34.0)
MCHC: 34 g/dL (ref 30.0–36.0)
MCV: 92.9 fL (ref 78.0–100.0)
Monocytes Absolute: 0.5 10*3/uL (ref 0.1–1.0)
Monocytes Relative: 8 % (ref 3–12)
NEUTROS ABS: 4 10*3/uL (ref 1.7–7.7)
NEUTROS PCT: 55 % (ref 43–77)
PLATELETS: 253 10*3/uL (ref 150–400)
RBC: 4.68 MIL/uL (ref 3.87–5.11)
RDW: 13.4 % (ref 11.5–15.5)
WBC: 7.1 10*3/uL (ref 4.0–10.5)

## 2014-03-12 LAB — TROPONIN I

## 2014-03-12 LAB — LIPASE, BLOOD: Lipase: 30 U/L (ref 11–59)

## 2014-03-12 NOTE — ED Notes (Signed)
C/o upper abd pain x 1 hour- called PCP and was directed to come to ED- reports similar attacks in the past

## 2014-03-12 NOTE — ED Provider Notes (Signed)
CSN: [id number]     Arrival date & time 03/12/14  1209 History   First MD Initiated Contact with Patient 03/12/14 1239     Chief Complaint  Patient presents with  . Abdominal Pain     (Consider location/radiation/quality/duration/timing/severity/associated sxs/prior Treatment) HPI Comments: Patient is a 63 year old female who presents with complaints of intermittent epigastric pain that has occurred intermittently over the past several weeks.    Patient is a 64 y.o. female presenting with abdominal pain. The history is provided by the patient.  Abdominal Pain Pain location:  Epigastric Pain quality: stabbing   Pain radiates to:  Back Pain severity:  Severe Onset quality:  Sudden Duration: 15 minutes. Timing:  Intermittent Progression:  Worsening Chronicity:  Recurrent Relieved by:  Nothing Worsened by:  Nothing tried Ineffective treatments:  None tried   Past Medical History  Diagnosis Date  . Depression   . GERD (gastroesophageal reflux disease)   . Ulcer   . Hyperlipidemia   . Thyroid disease    Past Surgical History  Procedure Laterality Date  . Cesarean section     Family History  Problem Relation Age of Onset  . Heart disease Mother   . Stroke Mother   . Alcohol abuse Father   . Cancer Maternal Grandmother     colon   History  Substance Use Topics  . Smoking status: Current Every Day Smoker -- 1.00 packs/day    Types: Cigarettes  . Smokeless tobacco: Never Used  . Alcohol Use: No   OB History   Grav Para Term Preterm Abortions TAB SAB Ect Mult Living                 Review of Systems  Gastrointestinal: Positive for abdominal pain.  All other systems reviewed and are negative.     Allergies  Penicillins cross reactors; Sulfa antibiotics; and Other  Home Medications   Prior to Admission medications   Medication Sig Start Date End Date Taking? Authorizing Provider  ALPRAZolam Duanne Moron) 0.25 MG tablet TAKE 1 TABLET 3 TIMES A DAY AS NEEDED  11/06/13   Marletta Lor, MD  aspirin 81 MG tablet Take 81 mg by mouth daily.      Historical Provider, MD  atorvastatin (LIPITOR) 40 MG tablet Take 1 tablet (40 mg total) by mouth daily. 05/15/13   Marletta Lor, MD  omeprazole (PRILOSEC) 20 MG capsule TAKE ONE CAPSULE BY MOUTH EVERY DAY 03/27/13   Marletta Lor, MD  SYNTHROID 75 MCG tablet TAKE 1 TABLET BY MOUTH DAILY (INS ONLY ALLOWS 30 DAY SUPPLY) 05/15/13   Marletta Lor, MD   BP 141/59  Pulse 104  Temp(Src) 97.9 F (36.6 C) (Oral)  Resp 16  Ht 5\' 4"  (1.626 m)  Wt 152 lb (68.947 kg)  BMI 26.08 kg/m2 Physical Exam  Nursing note and vitals reviewed. Constitutional: She is oriented to person, place, and time. She appears well-developed and well-nourished. No distress.  HENT:  Head: Normocephalic and atraumatic.  Neck: Normal range of motion. Neck supple.  Cardiovascular: Normal rate and regular rhythm.  Exam reveals no gallop and no friction rub.   No murmur heard. Pulmonary/Chest: Effort normal and breath sounds normal. No respiratory distress. She has no wheezes.  Abdominal: Soft. Bowel sounds are normal. She exhibits no distension. There is no tenderness.  Musculoskeletal: Normal range of motion.  Neurological: She is alert and oriented to person, place, and time.  Skin: Skin is warm and dry. She is not diaphoretic.  ED Course  Procedures (including critical care time) Labs Review Labs Reviewed  CBC WITH DIFFERENTIAL  COMPREHENSIVE METABOLIC PANEL  LIPASE, BLOOD    Imaging Review No results found.   EKG Interpretation   Date/Time:  Thursday March 12 2014 13:08:57 EDT Ventricular Rate:  84 PR Interval:  164 QRS Duration: 92 QT Interval:  350 QTC Calculation: 413 R Axis:   48 Text Interpretation:  Normal sinus rhythm Normal ECG Confirmed by DELOS   MD, Isabela Nardelli ([id number]) on 03/12/2014 2:40:42 PM      MDM   Final diagnoses:  None    Patient presents here with several episodes of  upper abdominal pain lasting approximately 15 minutes each over the past several weeks. Her symptoms do not sound cardiac and there is no evidence of a cardiac etiology on her EKG or the troponin. Her liver enzymes, white count, and ultrasound do not reflect a biliary cause. She is now symptom-free and appears very comfortable. I feel as though she is appropriate for discharge with followup with her primary Dr. She is to return if her symptoms substantially worsen.    Veryl Speak, MD 03/12/14 [phone]

## 2014-03-12 NOTE — Telephone Encounter (Signed)
Noted  

## 2014-03-12 NOTE — Telephone Encounter (Signed)
Patient Information:  Caller Name: Tonya Cherry  Phone: [phone]  Patient: Tonya, Cherry  Gender: Female  DOB: 14-Oct-1950  Age: 63 Years  PCP: Bluford Kaufmann (Family Practice > 1yrs old)  Office Follow Up:  Does the office need to follow up with this patient?: No  Instructions For The Office: N/A  RN Note:  Per disposition advised pt to go to [facility] ED for eval.  Symptoms  Reason For Call & Symptoms: Pt states she thinks she is "having gallbladder attacks."  Pt report abdominal pain upper center (under the ribs)  radiating to back and up bewteen shoulder blades. Pt reports she has had 3 episode lasting 10-15 minutes since 03/07/14 and pain is 9 on pain scale 0-10.  Reviewed Health History In EMR: Yes  Reviewed Medications In EMR: Yes  Reviewed Allergies In EMR: Yes  Reviewed Surgeries / Procedures: Yes  Date of Onset of Symptoms: 03/07/2014  Guideline(s) Used:  Abdominal Pain - Female  Abdominal Pain - Upper  Disposition Per Guideline:   Go to ED Now  Reason For Disposition Reached:   Pain lasting > 10 minutes and over 68 years old  Advice Given:  N/A  Patient Will Follow Care Advice:  YES

## 2014-03-12 NOTE — Discharge Instructions (Signed)
Return to the emergency department if he develops severe pain, high fever, bloody stool or vomit, or other new and concerning symptoms.   Abdominal Pain Many things can cause abdominal pain. Usually, abdominal pain is not caused by a disease and will improve without treatment. It can often be observed and treated at home. Your health care provider will do a physical exam and possibly order blood tests and X-rays to help determine the seriousness of your pain. However, in many cases, more time must pass before a clear cause of the pain can be found. Before that point, your health care provider may not know if you need more testing or further treatment. HOME CARE INSTRUCTIONS  Monitor your abdominal pain for any changes. The following actions may help to alleviate any discomfort you are experiencing:  Only take over-the-counter or prescription medicines as directed by your health care provider.  Do not take laxatives unless directed to do so by your health care provider.  Try a clear liquid diet (broth, tea, or water) as directed by your health care provider. Slowly move to a bland diet as tolerated. SEEK MEDICAL CARE IF:  You have unexplained abdominal pain.  You have abdominal pain associated with nausea or diarrhea.  You have pain when you urinate or have a bowel movement.  You experience abdominal pain that wakes you in the night.  You have abdominal pain that is worsened or improved by eating food.  You have abdominal pain that is worsened with eating fatty foods.  You have a fever. SEEK IMMEDIATE MEDICAL CARE IF:   Your pain does not go away within 2 hours.  You keep throwing up (vomiting).  Your pain is felt only in portions of the abdomen, such as the right side or the left lower portion of the abdomen.  You pass bloody or black tarry stools. MAKE SURE YOU:  Understand these instructions.   Will watch your condition.   Will get help right away if you are not doing  well or get worse.  Document Released: 03/22/2005 Document Revised: 06/17/2013 Document Reviewed: 02/19/2013 [facility] Patient Information 2015 [place], [place]. This information is not intended to replace advice given to you by your health care provider. Make sure you discuss any questions you have with your health care provider.

## 2014-04-10 ENCOUNTER — Other Ambulatory Visit: Payer: Self-pay

## 2014-04-14 ENCOUNTER — Other Ambulatory Visit: Payer: Self-pay | Admitting: Internal Medicine

## 2014-05-25 ENCOUNTER — Other Ambulatory Visit: Payer: Self-pay | Admitting: Internal Medicine

## 2014-05-30 ENCOUNTER — Other Ambulatory Visit: Payer: Self-pay | Admitting: Internal Medicine

## 2014-07-09 ENCOUNTER — Ambulatory Visit (INDEPENDENT_AMBULATORY_CARE_PROVIDER_SITE_OTHER): Payer: BLUE CROSS/BLUE SHIELD | Admitting: Internal Medicine

## 2014-07-09 ENCOUNTER — Encounter: Payer: Self-pay | Admitting: Internal Medicine

## 2014-07-09 ENCOUNTER — Encounter: Payer: Self-pay | Admitting: *Deleted

## 2014-07-09 VITALS — BP 128/80 | HR 98 | Temp 98.3°F | Resp 20 | Ht 64.0 in | Wt 149.0 lb

## 2014-07-09 DIAGNOSIS — J069 Acute upper respiratory infection, unspecified: Secondary | ICD-10-CM

## 2014-07-09 DIAGNOSIS — B9789 Other viral agents as the cause of diseases classified elsewhere: Principal | ICD-10-CM

## 2014-07-09 NOTE — Progress Notes (Signed)
Subjective:    Patient ID: Tonya Cherry, female    DOB: 17-Oct-1950, 64 y.o.   MRN: [id number]  HPI 64 year old patient who presents with a one-week history of cough and congestion.  2 nights ago she had paroxysms of coughing and briefly could not inhale or exhale.  This lasted seconds and has not reoccurred.  In general, her cough seems to be improving.  There has been no wheezing, fever or appearance sputum production.  Social history.  Adjusting to  the death of her husband in 05/20/2023 from lung cancer  Past Medical History  Diagnosis Date  . Depression   . GERD (gastroesophageal reflux disease)   . Ulcer   . Hyperlipidemia   . Thyroid disease     History   Social History  . Marital Status: Married    Spouse Name: N/A    Number of Children: N/A  . Years of Education: N/A   Occupational History  . Not on file.   Social History Main Topics  . Smoking status: Current Every Day Smoker -- 1.00 packs/day    Types: Cigarettes  . Smokeless tobacco: Never Used  . Alcohol Use: No  . Drug Use: No  . Sexual Activity: Not on file   Other Topics Concern  . Not on file   Social History Narrative    Past Surgical History  Procedure Laterality Date  . Cesarean section      Family History  Problem Relation Age of Onset  . Heart disease Mother   . Stroke Mother   . Alcohol abuse Father   . Cancer Maternal Grandmother     colon    Allergies  Allergen Reactions  . Penicillins Cross Reactors Rash  . Sulfa Antibiotics Rash  . Other Diarrhea    "mycin"     Current Outpatient Prescriptions on File Prior to Visit  Medication Sig Dispense Refill  . ALPRAZolam (XANAX) 0.25 MG tablet TAKE 1 TABLET BY MOUTH 3 TIMES A DAY AS NEEDED 90 tablet 2  . atorvastatin (LIPITOR) 40 MG tablet Take 1 tablet (40 mg total) by mouth daily. 90 tablet 3  . omeprazole (PRILOSEC) 20 MG capsule TAKE ONE CAPSULE BY MOUTH EVERY DAY 90 capsule 3  . SYNTHROID 75 MCG tablet TAKE 1 TABLET BY MOUTH  EVERY DAY 30 tablet 5   No current facility-administered medications on file prior to visit.    BP 128/80 mmHg  Pulse 98  Temp(Src) 98.3 F (36.8 C) (Oral)  Resp 20  Ht 5\' 4"  (1.626 m)  Wt 149 lb (67.586 kg)  BMI 25.56 kg/m2  SpO2 98%      Review of Systems  Constitutional: Positive for activity change, appetite change and fatigue.  HENT: Negative for congestion, dental problem, hearing loss, rhinorrhea, sinus pressure, sore throat and tinnitus.   Eyes: Negative for pain, discharge and visual disturbance.  Respiratory: Positive for cough and shortness of breath.   Cardiovascular: Negative for chest pain, palpitations and leg swelling.  Gastrointestinal: Negative for nausea, vomiting, abdominal pain, diarrhea, constipation, blood in stool and abdominal distention.  Genitourinary: Negative for dysuria, urgency, frequency, hematuria, flank pain, vaginal bleeding, vaginal discharge, difficulty urinating, vaginal pain and pelvic pain.  Musculoskeletal: Negative for joint swelling, arthralgias and gait problem.  Skin: Negative for rash.  Neurological: Negative for dizziness, syncope, speech difficulty, weakness, numbness and headaches.  Hematological: Negative for adenopathy.  Psychiatric/Behavioral: Positive for dysphoric mood. Negative for behavioral problems and agitation. The patient is not nervous/anxious.  Objective:   Physical Exam  Constitutional: She is oriented to person, place, and time. She appears well-developed and well-nourished.  HENT:  Head: Normocephalic.  Right Ear: External ear normal.  Left Ear: External ear normal.  Mouth/Throat: Oropharynx is clear and moist.  Eyes: Conjunctivae and EOM are normal. Pupils are equal, round, and reactive to light.  Neck: Normal range of motion. Neck supple. No thyromegaly present.  Cardiovascular: Normal rate, regular rhythm, normal heart sounds and intact distal pulses.   Pulmonary/Chest: Effort normal and breath  sounds normal. No respiratory distress. She has no wheezes. She has no rales.  O2 saturation 98  Abdominal: Soft. Bowel sounds are normal. She exhibits no mass. There is no tenderness.  Musculoskeletal: Normal range of motion.  Lymphadenopathy:    She has no cervical adenopathy.  Neurological: She is alert and oriented to person, place, and time.  Skin: Skin is warm and dry. No rash noted.  Psychiatric: She has a normal mood and affect. Her behavior is normal.          Assessment & Plan:   Viral URI with cough.  Seems to be improving.  Probably had fleeting episode of laryngospasm associated with cough.  This has not reoccurred.'s.  Total smoking cessation encouraged Will continue symptomatic treatment with Mucinex DM  Schedule CPX at her convenience

## 2014-07-09 NOTE — Patient Instructions (Signed)
Limit your sodium (Salt) intake  Return in 6 months for follow-up  

## 2014-07-09 NOTE — Progress Notes (Signed)
Pre visit review using our clinic review tool, if applicable. No additional management support is needed unless otherwise documented below in the visit note. 

## 2014-08-25 ENCOUNTER — Other Ambulatory Visit: Payer: Self-pay | Admitting: Internal Medicine

## 2014-11-16 ENCOUNTER — Telehealth: Payer: Self-pay | Admitting: Family Medicine

## 2014-11-16 NOTE — Telephone Encounter (Signed)
Called and spoke to the pt to see if she has had a recent mammogram.  She stated that is usually ordered by her ob/gyn but she has not been in a few years.  Plans on making an appt and will have it done at that time.

## 2014-11-18 ENCOUNTER — Other Ambulatory Visit: Payer: Self-pay | Admitting: Internal Medicine

## 2015-01-04 ENCOUNTER — Other Ambulatory Visit: Payer: Self-pay | Admitting: Internal Medicine

## 2015-01-18 ENCOUNTER — Other Ambulatory Visit: Payer: Self-pay | Admitting: Internal Medicine

## 2015-02-19 ENCOUNTER — Other Ambulatory Visit: Payer: Self-pay | Admitting: Internal Medicine

## 2015-04-02 ENCOUNTER — Other Ambulatory Visit (INDEPENDENT_AMBULATORY_CARE_PROVIDER_SITE_OTHER): Payer: BLUE CROSS/BLUE SHIELD

## 2015-04-02 DIAGNOSIS — Z0000 Encounter for general adult medical examination without abnormal findings: Secondary | ICD-10-CM

## 2015-04-02 LAB — CBC WITH DIFFERENTIAL/PLATELET
BASOS ABS: 0 10*3/uL (ref 0.0–0.1)
Basophils Relative: 0.4 % (ref 0.0–3.0)
EOS ABS: 0.1 10*3/uL (ref 0.0–0.7)
Eosinophils Relative: 1.7 % (ref 0.0–5.0)
HCT: 42.8 % (ref 36.0–46.0)
Hemoglobin: 14.5 g/dL (ref 12.0–15.0)
LYMPHS ABS: 2.2 10*3/uL (ref 0.7–4.0)
Lymphocytes Relative: 27.1 % (ref 12.0–46.0)
MCHC: 33.9 g/dL (ref 30.0–36.0)
MCV: 93.7 fl (ref 78.0–100.0)
MONO ABS: 0.5 10*3/uL (ref 0.1–1.0)
Monocytes Relative: 6.6 % (ref 3.0–12.0)
NEUTROS ABS: 5.2 10*3/uL (ref 1.4–7.7)
Neutrophils Relative %: 64.2 % (ref 43.0–77.0)
PLATELETS: 281 10*3/uL (ref 150.0–400.0)
RBC: 4.57 Mil/uL (ref 3.87–5.11)
RDW: 13.1 % (ref 11.5–15.5)
WBC: 8.1 10*3/uL (ref 4.0–10.5)

## 2015-04-02 LAB — TSH: TSH: 1.44 u[IU]/mL (ref 0.35–4.50)

## 2015-04-02 LAB — POCT URINALYSIS DIPSTICK
BILIRUBIN UA: NEGATIVE
Glucose, UA: NEGATIVE
Ketones, UA: NEGATIVE
Nitrite, UA: NEGATIVE
PH UA: 6
PROTEIN UA: NEGATIVE
Spec Grav, UA: 1.02
Urobilinogen, UA: 1

## 2015-04-02 LAB — BASIC METABOLIC PANEL
BUN: 9 mg/dL (ref 6–23)
CALCIUM: 10.7 mg/dL — AB (ref 8.4–10.5)
CO2: 29 meq/L (ref 19–32)
CREATININE: 0.81 mg/dL (ref 0.40–1.20)
Chloride: 107 mEq/L (ref 96–112)
GFR: 75.88 mL/min (ref >60.00)
GLUCOSE: 98 mg/dL (ref 70–99)
Potassium: 5.2 mEq/L — ABNORMAL HIGH (ref 3.5–5.1)
Sodium: 143 mEq/L (ref 135–145)

## 2015-04-02 LAB — LIPID PANEL
Cholesterol: 240 mg/dL — ABNORMAL HIGH (ref 0–200)
HDL: 54.8 mg/dL (ref >39.00)
LDL CALC: 156 mg/dL — AB (ref 0–99)
NONHDL: 185.29
Total CHOL/HDL Ratio: 4
Triglycerides: 148 mg/dL (ref 0.0–149.0)
VLDL: 29.6 mg/dL (ref 0.0–40.0)

## 2015-04-02 LAB — HEPATIC FUNCTION PANEL
ALBUMIN: 4.2 g/dL (ref 3.5–5.2)
ALK PHOS: 84 U/L (ref 39–117)
ALT: 14 U/L (ref 0–35)
AST: 17 U/L (ref 0–37)
BILIRUBIN TOTAL: 0.4 mg/dL (ref 0.2–1.2)
Bilirubin, Direct: 0 mg/dL (ref 0.0–0.3)
Total Protein: 7.4 g/dL (ref 6.0–8.3)

## 2015-04-08 ENCOUNTER — Encounter: Payer: BLUE CROSS/BLUE SHIELD | Admitting: Internal Medicine

## 2015-04-19 ENCOUNTER — Ambulatory Visit (INDEPENDENT_AMBULATORY_CARE_PROVIDER_SITE_OTHER): Payer: BLUE CROSS/BLUE SHIELD | Admitting: Internal Medicine

## 2015-04-19 ENCOUNTER — Encounter: Payer: Self-pay | Admitting: Internal Medicine

## 2015-04-19 VITALS — BP 122/80 | HR 100 | Temp 98.5°F | Resp 14 | Ht 63.0 in | Wt 152.0 lb

## 2015-04-19 DIAGNOSIS — Z0000 Encounter for general adult medical examination without abnormal findings: Secondary | ICD-10-CM | POA: Diagnosis not present

## 2015-04-19 DIAGNOSIS — E039 Hypothyroidism, unspecified: Secondary | ICD-10-CM

## 2015-04-19 DIAGNOSIS — Z720 Tobacco use: Secondary | ICD-10-CM

## 2015-04-19 DIAGNOSIS — R7302 Impaired glucose tolerance (oral): Secondary | ICD-10-CM

## 2015-04-19 DIAGNOSIS — E785 Hyperlipidemia, unspecified: Secondary | ICD-10-CM

## 2015-04-19 DIAGNOSIS — I6523 Occlusion and stenosis of bilateral carotid arteries: Secondary | ICD-10-CM

## 2015-04-19 MED ORDER — ALPRAZOLAM 0.25 MG PO TABS: 0.2500 mg | ORAL_TABLET | Freq: Three times a day (TID) | ORAL | Status: DC | PRN

## 2015-04-19 NOTE — Progress Notes (Signed)
Subjective:    Patient ID: Tonya Cherry, female    DOB: Apr 23, 1951, 64 y.o.   MRN: [id number]  HPI  Pre-visit discussion using our clinic review tool. No additional management support is needed unless otherwise documented below in the visit note.  6 -year-old patient who is seen today for a wellness exam. Active medical problems include dyslipidemia ongoing tobacco use and hypothyroidism. She has moderate carotid artery stenosis which was stable in September of last year. No focal neurological complaints Laboratory studies reviewed Colonoscopy declined Flu vaccine  Other immunizations declined  The patient has discontinued low-dose aspirin due to stomach upset.  She also has not been taking atorvastatin due to safety concerns  Patient does have some dermatological concerns.  A brother did die of melanoma at age 88  Past Medical History  Diagnosis Date  . Depression   . GERD (gastroesophageal reflux disease)   . Ulcer   . Hyperlipidemia   . Thyroid disease     Social History   Social History  . Marital Status: Married    Spouse Name: N/A  . Number of Children: N/A  . Years of Education: N/A   Occupational History  . Not on file.   Social History Main Topics  . Smoking status: Current Every Day Smoker -- 1.00 packs/day    Types: Cigarettes  . Smokeless tobacco: Never Used  . Alcohol Use: No  . Drug Use: No  . Sexual Activity: Not on file   Other Topics Concern  . Not on file   Social History Narrative    Past Surgical History  Procedure Laterality Date  . Cesarean section      Family History  Problem Relation Age of Onset  . Heart disease Mother   . Stroke Mother   . Alcohol abuse Father   . Cancer Maternal Grandmother     colon    Allergies  Allergen Reactions  . Penicillins Cross Reactors Rash  . Sulfa Antibiotics Rash  . Other Diarrhea    "mycin"     Current Outpatient Prescriptions on File Prior to Visit  Medication Sig Dispense Refill   . ALPRAZolam (XANAX) 0.25 MG tablet TAKE 1 TABLET THREE TIMES A DAY AS NEEDED 90 tablet 1  . SYNTHROID 75 MCG tablet TAKE 1 TABLET BY MOUTH EVERY DAY 30 tablet 5  . atorvastatin (LIPITOR) 40 MG tablet Take 1 tablet (40 mg total) by mouth daily. (Patient not taking: Reported on 04/19/2015) 90 tablet 3   No current facility-administered medications on file prior to visit.    BP 122/80 mmHg  Pulse 100  Temp(Src) 98.5 F (36.9 C)  Resp 14  Ht 5\' 3"  (1.6 m)  Wt 152 lb (68.947 kg)  BMI 26.93 kg/m2       Review of Systems  Constitutional: Negative.   HENT: Negative for congestion, dental problem, hearing loss, rhinorrhea, sinus pressure, sore throat and tinnitus.   Eyes: Negative for pain, discharge and visual disturbance.  Respiratory: Negative for cough and shortness of breath.   Cardiovascular: Positive for leg swelling. Negative for chest pain and palpitations.  Gastrointestinal: Negative for nausea, vomiting, abdominal pain, diarrhea, constipation, blood in stool and abdominal distention.  Genitourinary: Negative for dysuria, urgency, frequency, hematuria, flank pain, vaginal bleeding, vaginal discharge, difficulty urinating, vaginal pain and pelvic pain.  Musculoskeletal: Positive for arthralgias, neck pain and neck stiffness. Negative for joint swelling and gait problem.  Skin: Positive for rash.  Neurological: Negative for dizziness, syncope, speech difficulty, weakness,  numbness and headaches.  Hematological: Negative for adenopathy.  Psychiatric/Behavioral: Negative for behavioral problems, dysphoric mood and agitation. The patient is not nervous/anxious.        Objective:   Physical Exam  Constitutional: She is oriented to person, place, and time. She appears well-developed and well-nourished.  HENT:  Head: Normocephalic and atraumatic.  Right Ear: External ear normal.  Left Ear: External ear normal.  Mouth/Throat: Oropharynx is clear and moist.  Eyes: Conjunctivae  and EOM are normal.  A small papule noted involving the medial left lower lid Another 3 mm papule involving the bridge of the nose on the left  Neck: Normal range of motion. Neck supple. No JVD present. No thyromegaly present.  Decreased left carotid upstroke and no bruits noted  Cardiovascular: Normal rate, regular rhythm, normal heart sounds and intact distal pulses.   No murmur heard. Dorsalis pedis pulses diminished  Pulmonary/Chest: Effort normal and breath sounds normal. She has no wheezes. She has no rales.  Abdominal: Soft. Bowel sounds are normal. She exhibits no distension and no mass. There is no tenderness. There is no rebound and no guarding.  Genitourinary: Vagina normal.  Musculoskeletal: Normal range of motion. She exhibits no edema or tenderness.  Neurological: She is alert and oriented to person, place, and time. She has normal reflexes. No cranial nerve deficit. She exhibits normal muscle tone. Coordination normal.  Skin: Skin is warm and dry. No rash noted.  Marked dryness and flaking of the skin involving the soles of both feet  Psychiatric: She has a normal mood and affect. Her behavior is normal.          Assessment & Plan:   Preventive health examination Eczema of the feet Dyslipidemia. Continue atorvastatin Moderate carotid artery stenosis unchanged. Followup carotid artery Doppler study  Tobacco abuse smoking cessation encouraged  Recheck one year Colonoscopy flu vaccine urged. She will consider Will schedule ophthalmology visit for general checkup and also biopsy of the papular lesion involving the right medial lower lid

## 2015-04-19 NOTE — Progress Notes (Signed)
Pre visit review using our clinic review tool, if applicable. No additional management support is needed unless otherwise documented below in the visit note. 

## 2015-04-19 NOTE — Patient Instructions (Signed)
Ophthalmology referral Consider low-dose CT scanning of the chest  Smoking tobacco is very bad for your health. You should stop smoking immediately.    It is important that you exercise regularly, at least 20 minutes 3 to 4 times per week.  If you develop chest pain or shortness of breath seek  medical attention.  Menopause is a normal process in which your reproductive ability comes to an end. This process happens gradually over a span of months to years, usually between the ages of 9 and 67. Menopause is complete when you have missed 12 consecutive menstrual periods. It is important to talk with your health care provider about some of the most common conditions that affect postmenopausal women, such as heart disease, cancer, and bone loss (osteoporosis). Adopting a healthy lifestyle and getting preventive care can help to promote your health and wellness. Those actions can also lower your chances of developing some of these common conditions. WHAT SHOULD I KNOW ABOUT MENOPAUSE? During menopause, you may experience a number of symptoms, such as:  Moderate-to-severe hot flashes.  Night sweats.  Decrease in sex drive.  Mood swings.  Headaches.  Tiredness.  Irritability.  Memory problems.  Insomnia. Choosing to treat or not to treat menopausal changes is an individual decision that you make with your health care provider. WHAT SHOULD I KNOW ABOUT HORMONE REPLACEMENT THERAPY AND SUPPLEMENTS? Hormone therapy products are effective for treating symptoms that are associated with menopause, such as hot flashes and night sweats. Hormone replacement carries certain risks, especially as you become older. If you are thinking about using estrogen or estrogen with progestin treatments, discuss the benefits and risks with your health care provider. WHAT SHOULD I KNOW ABOUT HEART DISEASE AND STROKE? Heart disease, heart attack, and stroke become more likely as you age. This may be due, in part, to  the hormonal changes that your body experiences during menopause. These can affect how your body processes dietary fats, triglycerides, and cholesterol. Heart attack and stroke are both medical emergencies. There are many things that you can do to help prevent heart disease and stroke:  Have your blood pressure checked at least every 1-2 years. High blood pressure causes heart disease and increases the risk of stroke.  If you are 81-51 years old, ask your health care provider if you should take aspirin to prevent a heart attack or a stroke.  Do not use any tobacco products, including cigarettes, chewing tobacco, or electronic cigarettes. If you need help quitting, ask your health care provider.  It is important to eat a healthy diet and maintain a healthy weight.  Be sure to include plenty of vegetables, fruits, low-fat dairy products, and lean protein.  Avoid eating foods that are high in solid fats, added sugars, or salt (sodium).  Get regular exercise. This is one of the most important things that you can do for your health.  Try to exercise for at least 150 minutes each week. The type of exercise that you do should increase your heart rate and make you sweat. This is known as moderate-intensity exercise.  Try to do strengthening exercises at least twice each week. Do these in addition to the moderate-intensity exercise.  Know your numbers.Ask your health care provider to check your cholesterol and your blood glucose. Continue to have your blood tested as directed by your health care provider. WHAT SHOULD I KNOW ABOUT CANCER SCREENING? There are several types of cancer. Take the following steps to reduce your risk and to  catch any cancer development as early as possible. Breast Cancer  Practice breast self-awareness.  This means understanding how your breasts normally appear and feel.  It also means doing regular breast self-exams. Let your health care provider know about any  changes, no matter how small.  If you are 50 or older, have a clinician do a breast exam (clinical breast exam or CBE) every year. Depending on your age, family history, and medical history, it may be recommended that you also have a yearly breast X-ray (mammogram).  If you have a family history of breast cancer, talk with your health care provider about genetic screening.  If you are at high risk for breast cancer, talk with your health care provider about having an MRI and a mammogram every year.  Breast cancer (BRCA) gene test is recommended for women who have family members with BRCA-related cancers. Results of the assessment will determine the need for genetic counseling and BRCA1 and for BRCA2 testing. BRCA-related cancers include these types:  Breast. This occurs in males or females.  Ovarian.  Tubal. This may also be called fallopian tube cancer.  Cancer of the abdominal or pelvic lining (peritoneal cancer).  Prostate.  Pancreatic. Cervical, Uterine, and Ovarian Cancer Your health care provider may recommend that you be screened regularly for cancer of the pelvic organs. These include your ovaries, uterus, and vagina. This screening involves a pelvic exam, which includes checking for microscopic changes to the surface of your cervix (Pap test).  For women ages 21-65, health care providers may recommend a pelvic exam and a Pap test every three years. For women ages 5-65, they may recommend the Pap test and pelvic exam, combined with testing for human papilloma virus (HPV), every five years. Some types of HPV increase your risk of cervical cancer. Testing for HPV may also be done on women of any age who have unclear Pap test results.  Other health care providers may not recommend any screening for nonpregnant women who are considered low risk for pelvic cancer and have no symptoms. Ask your health care provider if a screening pelvic exam is right for you.  If you have had past  treatment for cervical cancer or a condition that could lead to cancer, you need Pap tests and screening for cancer for at least 20 years after your treatment. If Pap tests have been discontinued for you, your risk factors (such as having a new sexual partner) need to be reassessed to determine if you should start having screenings again. Some women have medical problems that increase the chance of getting cervical cancer. In these cases, your health care provider may recommend that you have screening and Pap tests more often.  If you have a family history of uterine cancer or ovarian cancer, talk with your health care provider about genetic screening.  If you have vaginal bleeding after reaching menopause, tell your health care provider.  There are currently no reliable tests available to screen for ovarian cancer. Lung Cancer Lung cancer screening is recommended for adults 55-109 years old who are at high risk for lung cancer because of a history of smoking. A yearly low-dose CT scan of the lungs is recommended if you:  Currently smoke.  Have a history of at least 30 pack-years of smoking and you currently smoke or have quit within the past 15 years. A pack-year is smoking an average of one pack of cigarettes per day for one year. Yearly screening should:  Continue until it  has been 15 years since you quit.  Stop if you develop a health problem that would prevent you from having lung cancer treatment. Colorectal Cancer  This type of cancer can be detected and can often be prevented.  Routine colorectal cancer screening usually begins at age 52 and continues through age 62.  If you have risk factors for colon cancer, your health care provider may recommend that you be screened at an earlier age.  If you have a family history of colorectal cancer, talk with your health care provider about genetic screening.  Your health care provider may also recommend using home test kits to check for  hidden blood in your stool.  A small camera at the end of a tube can be used to examine your colon directly (sigmoidoscopy or colonoscopy). This is done to check for the earliest forms of colorectal cancer.  Direct examination of the colon should be repeated every 5-10 years until age 63. However, if early forms of precancerous polyps or small growths are found or if you have a family history or genetic risk for colorectal cancer, you may need to be screened more often. Skin Cancer  Check your skin from head to toe regularly.  Monitor any moles. Be sure to tell your health care provider:  About any new moles or changes in moles, especially if there is a change in a mole's shape or color.  If you have a mole that is larger than the size of a pencil eraser.  If any of your family members has a history of skin cancer, especially at a young age, talk with your health care provider about genetic screening.  Always use sunscreen. Apply sunscreen liberally and repeatedly throughout the day.  Whenever you are outside, protect yourself by wearing long sleeves, pants, a wide-brimmed hat, and sunglasses. WHAT SHOULD I KNOW ABOUT OSTEOPOROSIS? Osteoporosis is a condition in which bone destruction happens more quickly than new bone creation. After menopause, you may be at an increased risk for osteoporosis. To help prevent osteoporosis or the bone fractures that can happen because of osteoporosis, the following is recommended:  If you are 28-78 years old, get at least 1,000 mg of calcium and at least 600 mg of vitamin D per day.  If you are older than age 26 but younger than age 71, get at least 1,200 mg of calcium and at least 600 mg of vitamin D per day.  If you are older than age 61, get at least 1,200 mg of calcium and at least 800 mg of vitamin D per day. Smoking and excessive alcohol intake increase the risk of osteoporosis. Eat foods that are rich in calcium and vitamin D, and do weight-bearing  exercises several times each week as directed by your health care provider. WHAT SHOULD I KNOW ABOUT HOW MENOPAUSE AFFECTS [place]? Depression may occur at any age, but it is more common as you become older. Common symptoms of depression include:  Low or sad mood.  Changes in sleep patterns.  Changes in appetite or eating patterns.  Feeling an overall lack of motivation or enjoyment of activities that you previously enjoyed.  Frequent crying spells. Talk with your health care provider if you think that you are experiencing depression. WHAT SHOULD I KNOW ABOUT IMMUNIZATIONS? It is important that you get and maintain your immunizations. These include:  Tetanus, diphtheria, and pertussis (Tdap) booster vaccine.  Influenza every year before the flu season begins.  Pneumonia vaccine.  Shingles  vaccine. Your health care provider may also recommend other immunizations.   This information is not intended to replace advice given to you by your health care provider. Make sure you discuss any questions you have with your health care provider.   Document Released: 08/04/2005 Document Revised: 07/03/2014 Document Reviewed: 02/12/2014 Elsevier Interactive Patient Education 2016 [employer].   Schedule your colonoscopy to help detect colon cancer.  Schedule your mammogram.

## 2015-05-01 ENCOUNTER — Other Ambulatory Visit: Payer: Self-pay | Admitting: Internal Medicine

## 2015-05-03 MED ORDER — ATORVASTATIN CALCIUM 40 MG PO TABS: 40.0000 mg | ORAL_TABLET | Freq: Every day | ORAL | Status: DC

## 2015-05-03 NOTE — Addendum Note (Signed)
Addended by: Marian Sorrow on: 05/03/2015 01:46 PM   Modules accepted: Orders

## 2015-06-16 ENCOUNTER — Telehealth: Payer: Self-pay | Admitting: Internal Medicine

## 2015-06-16 ENCOUNTER — Other Ambulatory Visit: Payer: Self-pay | Admitting: Internal Medicine

## 2015-06-16 NOTE — Telephone Encounter (Signed)
Tonya Cherry called saying she has one pill left of Alprazolam. She'd like a refill sent to her pharmacy if possible. Please give her a call if necessary.  Pt's ph# [phone] Thank you.

## 2015-06-17 MED ORDER — ALPRAZOLAM 0.25 MG PO TABS: 0.2500 mg | ORAL_TABLET | Freq: Three times a day (TID) | ORAL | Status: DC | PRN

## 2015-06-17 NOTE — Telephone Encounter (Signed)
Left message on voicemail Rx called into pharmacy  

## 2015-08-07 ENCOUNTER — Other Ambulatory Visit: Payer: Self-pay | Admitting: Internal Medicine

## 2015-09-08 ENCOUNTER — Other Ambulatory Visit: Payer: Self-pay | Admitting: Internal Medicine

## 2015-09-08 NOTE — Telephone Encounter (Signed)
° ° ° °

## 2015-11-01 ENCOUNTER — Ambulatory Visit (INDEPENDENT_AMBULATORY_CARE_PROVIDER_SITE_OTHER): Payer: BLUE CROSS/BLUE SHIELD | Admitting: Internal Medicine

## 2015-11-01 ENCOUNTER — Encounter: Payer: Self-pay | Admitting: Internal Medicine

## 2015-11-01 VITALS — BP 132/74 | HR 95 | Temp 98.3°F | Resp 12 | Ht 64.0 in | Wt 154.0 lb

## 2015-11-01 DIAGNOSIS — E785 Hyperlipidemia, unspecified: Secondary | ICD-10-CM

## 2015-11-01 DIAGNOSIS — R7302 Impaired glucose tolerance (oral): Secondary | ICD-10-CM

## 2015-11-01 DIAGNOSIS — R079 Chest pain, unspecified: Secondary | ICD-10-CM

## 2015-11-01 DIAGNOSIS — R1013 Epigastric pain: Secondary | ICD-10-CM

## 2015-11-01 DIAGNOSIS — I6529 Occlusion and stenosis of unspecified carotid artery: Secondary | ICD-10-CM

## 2015-11-01 DIAGNOSIS — Z720 Tobacco use: Secondary | ICD-10-CM

## 2015-11-01 MED ORDER — ALPRAZOLAM 0.25 MG PO TABS: 0.2500 mg | ORAL_TABLET | Freq: Three times a day (TID) | ORAL | Status: DC | PRN

## 2015-11-01 MED ORDER — ATORVASTATIN CALCIUM 20 MG PO TABS: 20.0000 mg | ORAL_TABLET | Freq: Every day | ORAL | Status: DC

## 2015-11-01 NOTE — Patient Instructions (Signed)
Omeprazole 20 mg daily  Avoids foods high in acid such as tomatoes citrus juices, and spicy foods.  Avoid eating within two hours of lying down or before exercising.  Do not overheat.  Try smaller more frequent meals.  If symptoms persist, elevate the head of her bed 12 inches while sleeping.  Call or return to clinic prn if these symptoms worsen or fail to improve as anticipated.

## 2015-11-01 NOTE — Progress Notes (Signed)
Subjective:    Patient ID: Tonya Cherry, female    DOB: June 04, 1951, 65 y.o.   MRN: [id number]  HPI  65 year old patient who presents after an episode of acute epigastric pain.  This occurred over the weekend lasted approximately 5 minutes as described as a severe sharp sensation that radiated to both flank areas.  Denied any chest pain, nausea, shortness of breath or diaphoresis. She has multiple cardiac risk factors including PAD, dyslipidemia, impaired glucose tolerance and ongoing tobacco use. She had a similar episode about a year and a half ago and abdominal ultrasound was negative  Past Medical History  Diagnosis Date  . Depression   . GERD (gastroesophageal reflux disease)   . Ulcer   . Hyperlipidemia   . Thyroid disease      Social History   Social History  . Marital Status: Married    Spouse Name: N/A  . Number of Children: N/A  . Years of Education: N/A   Occupational History  . Not on file.   Social History Main Topics  . Smoking status: Current Every Day Smoker -- 1.00 packs/day    Types: Cigarettes  . Smokeless tobacco: Never Used  . Alcohol Use: No  . Drug Use: No  . Sexual Activity: Not on file   Other Topics Concern  . Not on file   Social History Narrative    Past Surgical History  Procedure Laterality Date  . Cesarean section      Family History  Problem Relation Age of Onset  . Heart disease Mother   . Stroke Mother   . Alcohol abuse Father   . Cancer Maternal Grandmother     colon    Allergies  Allergen Reactions  . Penicillins Cross Reactors Rash  . Sulfa Antibiotics Rash  . Other Diarrhea    "mycin"     Current Outpatient Prescriptions on File Prior to Visit  Medication Sig Dispense Refill  . atorvastatin (LIPITOR) 40 MG tablet Take 1 tablet (40 mg total) by mouth daily. 90 tablet 1  . SYNTHROID 75 MCG tablet TAKE 1 TABLET BY MOUTH EVERY DAY 30 tablet 5   No current facility-administered medications on file prior to  visit.    BP 132/74 mmHg  Pulse 95  Temp(Src) 98.3 F (36.8 C) (Oral)  Resp 12  Ht 5\' 4"  (1.626 m)  Wt 154 lb (69.854 kg)  BMI 26.42 kg/m2  SpO2 98%     Review of Systems  Constitutional: Negative.   HENT: Negative for congestion, dental problem, hearing loss, rhinorrhea, sinus pressure, sore throat and tinnitus.   Eyes: Negative for pain, discharge and visual disturbance.  Respiratory: Negative for cough and shortness of breath.   Cardiovascular: Negative for chest pain, palpitations and leg swelling.  Gastrointestinal: Positive for abdominal pain. Negative for nausea, vomiting, diarrhea, constipation, blood in stool and abdominal distention.  Genitourinary: Negative for dysuria, urgency, frequency, hematuria, flank pain, vaginal bleeding, vaginal discharge, difficulty urinating, vaginal pain and pelvic pain.  Musculoskeletal: Negative for joint swelling, arthralgias and gait problem.  Skin: Negative for rash.  Neurological: Negative for dizziness, syncope, speech difficulty, weakness, numbness and headaches.  Hematological: Negative for adenopathy.  Psychiatric/Behavioral: Negative for behavioral problems, dysphoric mood and agitation. The patient is not nervous/anxious.        Objective:   Physical Exam  Constitutional: She is oriented to person, place, and time. She appears well-developed and well-nourished.  HENT:  Head: Normocephalic.  Right Ear: External ear normal.  Left Ear: External ear normal.  Mouth/Throat: Oropharynx is clear and moist.  Eyes: Conjunctivae and EOM are normal. Pupils are equal, round, and reactive to light.  Neck: Normal range of motion. Neck supple. No thyromegaly present.  Cardiovascular: Normal rate, regular rhythm, normal heart sounds and intact distal pulses.   Pulmonary/Chest: Effort normal and breath sounds normal.  Abdominal: Soft. Bowel sounds are normal. She exhibits no distension and no mass. There is no tenderness. There is no  rebound and no guarding.  Musculoskeletal: Normal range of motion.  Lymphadenopathy:    She has no cervical adenopathy.  Neurological: She is alert and oriented to person, place, and time.  Skin: Skin is warm and dry. No rash noted.  Psychiatric: She has a normal mood and affect. Her behavior is normal.          Assessment & Plan:   Epigastric pain.  EKG reviewed and revealed some minor nonspecific ST changes. We'll place on antireflux diet and short-term PPI therapy.  She report any new or worsening symptoms Risk factor modification.  Encouraged.  She is very reluctant to continue atorvastatin.  Risk, benefit ratio discussed.  Will decrease dose to 20 mg Carotid artery stenosis Ongoing tobacco use.  Total smoking cessation encouraged

## 2015-11-01 NOTE — Progress Notes (Signed)
Pre visit review using our clinic review tool, if applicable. No additional management support is needed unless otherwise documented below in the visit note. 

## 2015-11-04 ENCOUNTER — Telehealth: Payer: Self-pay | Admitting: Internal Medicine

## 2015-11-04 MED ORDER — OMEPRAZOLE 20 MG PO CPDR: 20.0000 mg | DELAYED_RELEASE_CAPSULE | Freq: Every day | ORAL | Status: DC

## 2015-11-04 NOTE — Telephone Encounter (Signed)
Pt need new Rx for omeprazole 20 mg.  Pharm:  [facility] in [place]

## 2015-11-04 NOTE — Telephone Encounter (Signed)
Rx sent to pharmacy   

## 2016-02-29 ENCOUNTER — Other Ambulatory Visit: Payer: Self-pay | Admitting: Internal Medicine

## 2016-03-30 ENCOUNTER — Other Ambulatory Visit: Payer: Self-pay | Admitting: Internal Medicine

## 2016-05-31 ENCOUNTER — Other Ambulatory Visit: Payer: Self-pay | Admitting: Internal Medicine

## 2016-06-05 ENCOUNTER — Telehealth: Payer: Self-pay | Admitting: Internal Medicine

## 2016-06-05 NOTE — Telephone Encounter (Signed)
Spoke to pt, told her Rx was called into pharmacy on Friday need to check with pharmacy any problems please let me know. Pt verbalized understanding.

## 2016-06-05 NOTE — Telephone Encounter (Signed)
Pt needs refill on alprazolam 0.5 mg #90 for 30 day supply. [facility] [place]

## 2016-08-31 ENCOUNTER — Other Ambulatory Visit: Payer: Self-pay | Admitting: Internal Medicine

## 2016-11-03 ENCOUNTER — Other Ambulatory Visit: Payer: Self-pay | Admitting: Internal Medicine

## 2016-11-06 ENCOUNTER — Other Ambulatory Visit: Payer: Self-pay | Admitting: Internal Medicine

## 2016-12-04 ENCOUNTER — Other Ambulatory Visit: Payer: Self-pay

## 2016-12-04 ENCOUNTER — Ambulatory Visit: Payer: BLUE CROSS/BLUE SHIELD | Admitting: Internal Medicine

## 2016-12-05 MED ORDER — ALPRAZOLAM 0.5 MG PO TABS: 0.5000 mg | ORAL_TABLET | Freq: Three times a day (TID) | ORAL | 0 refills | Status: DC | PRN

## 2016-12-05 NOTE — Telephone Encounter (Signed)
Could you please handle this refill -I am working the phones and can't get to it. Thanks!

## 2016-12-19 ENCOUNTER — Encounter: Payer: Self-pay | Admitting: Internal Medicine

## 2016-12-19 ENCOUNTER — Ambulatory Visit (INDEPENDENT_AMBULATORY_CARE_PROVIDER_SITE_OTHER): Payer: BLUE CROSS/BLUE SHIELD | Admitting: Internal Medicine

## 2016-12-19 VITALS — BP 138/72 | HR 99 | Temp 98.0°F | Ht 64.0 in | Wt 151.0 lb

## 2016-12-19 DIAGNOSIS — R7302 Impaired glucose tolerance (oral): Secondary | ICD-10-CM | POA: Diagnosis not present

## 2016-12-19 DIAGNOSIS — I6523 Occlusion and stenosis of bilateral carotid arteries: Secondary | ICD-10-CM

## 2016-12-19 DIAGNOSIS — Z720 Tobacco use: Secondary | ICD-10-CM | POA: Diagnosis not present

## 2016-12-19 DIAGNOSIS — E039 Hypothyroidism, unspecified: Secondary | ICD-10-CM

## 2016-12-19 MED ORDER — ALPRAZOLAM 0.5 MG PO TABS: 0.5000 mg | ORAL_TABLET | Freq: Three times a day (TID) | ORAL | 0 refills | Status: DC | PRN

## 2016-12-19 MED ORDER — OMEPRAZOLE 20 MG PO CPDR
20.0000 mg | DELAYED_RELEASE_CAPSULE | Freq: Every day | ORAL | 2 refills | Status: DC
Start: 2016-12-19 — End: 2017-04-03

## 2016-12-19 MED ORDER — LEVOTHYROXINE SODIUM 75 MCG PO TABS
75.0000 ug | ORAL_TABLET | Freq: Every day | ORAL | 3 refills | Status: DC
Start: 2016-12-19 — End: 2017-04-03

## 2016-12-19 MED ORDER — ATORVASTATIN CALCIUM 20 MG PO TABS: 20.0000 mg | ORAL_TABLET | Freq: Every day | ORAL | 5 refills | Status: DC

## 2016-12-19 NOTE — Patient Instructions (Signed)
Return in 4 months for your annual exam  Smoking tobacco is very bad for your health. You should stop smoking immediately.  Please check your blood pressure on a regular basis.  If it is consistently greater than 150/90, please make an office appointment.  Limit your sodium (Salt) intake

## 2016-12-19 NOTE — Progress Notes (Signed)
Subjective:    Patient ID: Tonya Cherry, female    DOB: December 18, 1950, 66 y.o.   MRN: [id number]  HPI  66 year old patient who is seen today for follow-up and medication update.  She has not been seen in over one year. Medical regimen includes alprazolam, and a needed refill of this medication prompted the visit She has a history of bilateral carotid artery stenosis and ongoing tobacco use.  Statin therapy has been encouraged in the past that she has been quite reluctant.  She has not been taking this medication lately She has hypothyroidism and which is to switch to a generic equivalent. Denies any cardiopulmonary complaints, or focal neurological symptoms  She is overdue for follow carotid artery Doppler studies  Past Medical History:  Diagnosis Date  . Depression   . GERD (gastroesophageal reflux disease)   . Hyperlipidemia   . Thyroid disease   . Ulcer      Social History   Social History  . Marital status: Married    Spouse name: N/A  . Number of children: N/A  . Years of education: N/A   Occupational History  . Not on file.   Social History Main Topics  . Smoking status: Current Every Day Smoker    Packs/day: 1.00    Types: Cigarettes  . Smokeless tobacco: Never Used  . Alcohol use No  . Drug use: No  . Sexual activity: Not on file   Other Topics Concern  . Not on file   Social History Narrative  . No narrative on file    Past Surgical History:  Procedure Laterality Date  . CESAREAN SECTION      Family History  Problem Relation Age of Onset  . Heart disease Mother   . Stroke Mother   . Alcohol abuse Father   . Cancer Maternal Grandmother        colon    Allergies  Allergen Reactions  . Penicillins Cross Reactors Rash  . Sulfa Antibiotics Rash  . Other Diarrhea    "mycin"     No current outpatient prescriptions on file prior to visit.   No current facility-administered medications on file prior to visit.     BP 138/72 (BP Location:  Left Arm, Patient Position: Sitting, Cuff Size: Normal)   Pulse 99   Temp 98 F (36.7 C) (Oral)   Ht 5\' 4"  (1.626 m)   Wt 151 lb (68.5 kg)   SpO2 97%   BMI 25.92 kg/m     Review of Systems  Constitutional: Negative.   HENT: Negative for congestion, dental problem, hearing loss, rhinorrhea, sinus pressure, sore throat and tinnitus.   Eyes: Negative for pain, discharge and visual disturbance.  Respiratory: Negative for cough and shortness of breath.   Cardiovascular: Negative for chest pain, palpitations and leg swelling.  Gastrointestinal: Negative for abdominal distention, abdominal pain, blood in stool, constipation, diarrhea, nausea and vomiting.  Genitourinary: Negative for difficulty urinating, dysuria, flank pain, frequency, hematuria, pelvic pain, urgency, vaginal bleeding, vaginal discharge and vaginal pain.  Musculoskeletal: Negative for arthralgias, gait problem and joint swelling.  Skin: Negative for rash.  Neurological: Negative for dizziness, syncope, speech difficulty, weakness, numbness and headaches.  Hematological: Negative for adenopathy.  Psychiatric/Behavioral: Negative for agitation, behavioral problems and dysphoric mood. The patient is not nervous/anxious.        Objective:   Physical Exam  Constitutional: She is oriented to person, place, and time. She appears well-developed and well-nourished.  Blood pressure 130/76  HENT:  Head: Normocephalic.  Right Ear: External ear normal.  Left Ear: External ear normal.  Mouth/Throat: Oropharynx is clear and moist.  Eyes: Conjunctivae and EOM are normal. Pupils are equal, round, and reactive to light.  Neck: Normal range of motion. Neck supple. No thyromegaly present.  No audible bruits  Cardiovascular: Normal rate, regular rhythm, normal heart sounds and intact distal pulses.   Pulmonary/Chest: Effort normal and breath sounds normal.  Abdominal: Soft. Bowel sounds are normal. She exhibits no mass. There is no  tenderness.  Musculoskeletal: Normal range of motion.  Lymphadenopathy:    She has no cervical adenopathy.  Neurological: She is alert and oriented to person, place, and time.  Skin: Skin is warm and dry. No rash noted.  Psychiatric: She has a normal mood and affect. Her behavior is normal.          Assessment & Plan:   Carotid artery stenosis.  Risk factor modification discussed including smoking cessation and statin therapy.  She wishes to defer further testing until she is Medicare eligible in the fall  Anxiety disorder.  Alprazolam refilledHypothyroidism.  Levothyroxine refilled Dyslipidemia.  Statin therapy and encouraged  CPX 4 months All medications updated  Nyoka Cowden

## 2017-01-17 ENCOUNTER — Telehealth: Payer: Self-pay | Admitting: Internal Medicine

## 2017-01-17 NOTE — Telephone Encounter (Signed)
Rx is due for refill on 01/18/17

## 2017-01-17 NOTE — Telephone Encounter (Signed)
° ° ° °  Pt request refill of the following:   ALPRAZolam (XANAX) 0.5 MG tablet  Phamacy:  [facility]

## 2017-01-18 ENCOUNTER — Other Ambulatory Visit: Payer: Self-pay | Admitting: Internal Medicine

## 2017-01-19 ENCOUNTER — Other Ambulatory Visit: Payer: Self-pay | Admitting: Internal Medicine

## 2017-01-19 MED ORDER — ALPRAZOLAM 0.5 MG PO TABS: 0.5000 mg | ORAL_TABLET | Freq: Three times a day (TID) | ORAL | 0 refills | Status: DC | PRN

## 2017-01-19 NOTE — Telephone Encounter (Signed)
Medication refill was called in to patient pharmacy.

## 2017-02-19 ENCOUNTER — Other Ambulatory Visit: Payer: Self-pay | Admitting: Internal Medicine

## 2017-02-19 NOTE — Telephone Encounter (Signed)
Pt only has a couple of pills left

## 2017-02-19 NOTE — Telephone Encounter (Signed)
Okay for refill?  

## 2017-03-15 ENCOUNTER — Encounter: Payer: Self-pay | Admitting: Internal Medicine

## 2017-03-21 ENCOUNTER — Other Ambulatory Visit: Payer: Self-pay | Admitting: Internal Medicine

## 2017-03-22 ENCOUNTER — Other Ambulatory Visit: Payer: Self-pay | Admitting: Internal Medicine

## 2017-04-03 ENCOUNTER — Ambulatory Visit (INDEPENDENT_AMBULATORY_CARE_PROVIDER_SITE_OTHER): Payer: PPO | Admitting: Internal Medicine

## 2017-04-03 ENCOUNTER — Encounter: Payer: Self-pay | Admitting: Internal Medicine

## 2017-04-03 VITALS — BP 110/66 | HR 89 | Temp 98.5°F | Ht 64.0 in | Wt 151.0 lb

## 2017-04-03 DIAGNOSIS — I6529 Occlusion and stenosis of unspecified carotid artery: Secondary | ICD-10-CM | POA: Diagnosis not present

## 2017-04-03 DIAGNOSIS — Z23 Encounter for immunization: Secondary | ICD-10-CM

## 2017-04-03 DIAGNOSIS — L989 Disorder of the skin and subcutaneous tissue, unspecified: Secondary | ICD-10-CM

## 2017-04-03 DIAGNOSIS — E785 Hyperlipidemia, unspecified: Secondary | ICD-10-CM | POA: Diagnosis not present

## 2017-04-03 DIAGNOSIS — Z0001 Encounter for general adult medical examination with abnormal findings: Secondary | ICD-10-CM | POA: Diagnosis not present

## 2017-04-03 DIAGNOSIS — Z0000 Encounter for general adult medical examination without abnormal findings: Secondary | ICD-10-CM

## 2017-04-03 DIAGNOSIS — E039 Hypothyroidism, unspecified: Secondary | ICD-10-CM | POA: Diagnosis not present

## 2017-04-03 DIAGNOSIS — Z720 Tobacco use: Secondary | ICD-10-CM

## 2017-04-03 DIAGNOSIS — R7302 Impaired glucose tolerance (oral): Secondary | ICD-10-CM | POA: Diagnosis not present

## 2017-04-03 LAB — COMPREHENSIVE METABOLIC PANEL
ALBUMIN: 4.6 g/dL (ref 3.5–5.2)
ALK PHOS: 96 U/L (ref 39–117)
ALT: 10 U/L (ref 0–35)
AST: 13 U/L (ref 0–37)
BILIRUBIN TOTAL: 0.5 mg/dL (ref 0.2–1.2)
BUN: 8 mg/dL (ref 6–23)
CALCIUM: 10.7 mg/dL — AB (ref 8.4–10.5)
CO2: 28 mEq/L (ref 19–32)
Chloride: 106 mEq/L (ref 96–112)
Creatinine, Ser: 0.77 mg/dL (ref 0.40–1.20)
GFR: 79.94 mL/min (ref >60.00)
Glucose, Bld: 93 mg/dL (ref 70–99)
Potassium: 4.2 mEq/L (ref 3.5–5.1)
Sodium: 141 mEq/L (ref 135–145)
Total Protein: 7.1 g/dL (ref 6.0–8.3)

## 2017-04-03 LAB — LIPID PANEL
CHOLESTEROL: 274 mg/dL — AB (ref 0–200)
HDL: 55.1 mg/dL (ref >39.00)
LDL Cholesterol: 191 mg/dL — ABNORMAL HIGH (ref 0–99)
NonHDL: 218.47
TRIGLYCERIDES: 138 mg/dL (ref 0.0–149.0)
Total CHOL/HDL Ratio: 5
VLDL: 27.6 mg/dL (ref 0.0–40.0)

## 2017-04-03 LAB — CBC WITH DIFFERENTIAL/PLATELET
BASOS ABS: 0 10*3/uL (ref 0.0–0.1)
Basophils Relative: 0.5 % (ref 0.0–3.0)
EOS ABS: 0.1 10*3/uL (ref 0.0–0.7)
Eosinophils Relative: 2.3 % (ref 0.0–5.0)
HEMATOCRIT: 45.2 % (ref 36.0–46.0)
HEMOGLOBIN: 15.3 g/dL — AB (ref 12.0–15.0)
LYMPHS PCT: 39.4 % (ref 12.0–46.0)
Lymphs Abs: 2.1 10*3/uL (ref 0.7–4.0)
MCHC: 33.9 g/dL (ref 30.0–36.0)
MCV: 95.6 fl (ref 78.0–100.0)
Monocytes Absolute: 0.4 10*3/uL (ref 0.1–1.0)
Monocytes Relative: 7.2 % (ref 3.0–12.0)
Neutro Abs: 2.7 10*3/uL (ref 1.4–7.7)
Neutrophils Relative %: 50.6 % (ref 43.0–77.0)
PLATELETS: 280 10*3/uL (ref 150.0–400.0)
RBC: 4.73 Mil/uL (ref 3.87–5.11)
RDW: 13.5 % (ref 11.5–15.5)
WBC: 5.4 10*3/uL (ref 4.0–10.5)

## 2017-04-03 LAB — TSH: TSH: 2.48 u[IU]/mL (ref 0.35–4.50)

## 2017-04-03 MED ORDER — OMEPRAZOLE 20 MG PO CPDR: 20.0000 mg | DELAYED_RELEASE_CAPSULE | Freq: Every day | ORAL | 2 refills | Status: DC

## 2017-04-03 MED ORDER — LEVOTHYROXINE SODIUM 75 MCG PO TABS: 75.0000 ug | ORAL_TABLET | Freq: Every day | ORAL | 3 refills | Status: DC

## 2017-04-03 MED ORDER — ATORVASTATIN CALCIUM 20 MG PO TABS: 20.0000 mg | ORAL_TABLET | Freq: Every day | ORAL | 5 refills | Status: DC

## 2017-04-03 MED ORDER — ALPRAZOLAM 0.5 MG PO TABS: 0.5000 mg | ORAL_TABLET | Freq: Three times a day (TID) | ORAL | 0 refills | Status: DC | PRN

## 2017-04-03 NOTE — Patient Instructions (Addendum)
Smoking tobacco is very bad for your health. You should stop smoking immediately.  Gynecology follow-up as scheduled  Bone density study  Schedule your colonoscopy to help detect colon cancer.  Schedule your mammogram.   Health Maintenance for Postmenopausal Women Menopause is a normal process in which your reproductive ability comes to an end. This process happens gradually over a span of months to years, usually between the ages of 41 and 87. Menopause is complete when you have missed 12 consecutive menstrual periods. It is important to talk with your health care provider about some of the most common conditions that affect postmenopausal women, such as heart disease, cancer, and bone loss (osteoporosis). Adopting a healthy lifestyle and getting preventive care can help to promote your health and wellness. Those actions can also lower your chances of developing some of these common conditions. What should I know about menopause? During menopause, you may experience a number of symptoms, such as:  Moderate-to-severe hot flashes.  Night sweats.  Decrease in sex drive.  Mood swings.  Headaches.  Tiredness.  Irritability.  Memory problems.  Insomnia.  Choosing to treat or not to treat menopausal changes is an individual decision that you make with your health care provider. What should I know about hormone replacement therapy and supplements? Hormone therapy products are effective for treating symptoms that are associated with menopause, such as hot flashes and night sweats. Hormone replacement carries certain risks, especially as you become older. If you are thinking about using estrogen or estrogen with progestin treatments, discuss the benefits and risks with your health care provider. What should I know about heart disease and stroke? Heart disease, heart attack, and stroke become more likely as you age. This may be due, in part, to the hormonal changes that your body  experiences during menopause. These can affect how your body processes dietary fats, triglycerides, and cholesterol. Heart attack and stroke are both medical emergencies. There are many things that you can do to help prevent heart disease and stroke:  Have your blood pressure checked at least every 1-2 years. High blood pressure causes heart disease and increases the risk of stroke.  If you are 18-14 years old, ask your health care provider if you should take aspirin to prevent a heart attack or a stroke.  Do not use any tobacco products, including cigarettes, chewing tobacco, or electronic cigarettes. If you need help quitting, ask your health care provider.  It is important to eat a healthy diet and maintain a healthy weight. ? Be sure to include plenty of vegetables, fruits, low-fat dairy products, and lean protein. ? Avoid eating foods that are high in solid fats, added sugars, or salt (sodium).  Get regular exercise. This is one of the most important things that you can do for your health. ? Try to exercise for at least 150 minutes each week. The type of exercise that you do should increase your heart rate and make you sweat. This is known as moderate-intensity exercise. ? Try to do strengthening exercises at least twice each week. Do these in addition to the moderate-intensity exercise.  Know your numbers.Ask your health care provider to check your cholesterol and your blood glucose. Continue to have your blood tested as directed by your health care provider.  What should I know about cancer screening? There are several types of cancer. Take the following steps to reduce your risk and to catch any cancer development as early as possible. Breast Cancer  Practice breast self-awareness. ?  This means understanding how your breasts normally appear and feel. ? It also means doing regular breast self-exams. Let your health care provider know about any changes, no matter how small.  If you  are 40 or older, have a clinician do a breast exam (clinical breast exam or CBE) every year. Depending on your age, family history, and medical history, it may be recommended that you also have a yearly breast X-ray (mammogram).  If you have a family history of breast cancer, talk with your health care provider about genetic screening.  If you are at high risk for breast cancer, talk with your health care provider about having an MRI and a mammogram every year.  Breast cancer (BRCA) gene test is recommended for women who have family members with BRCA-related cancers. Results of the assessment will determine the need for genetic counseling and BRCA1 and for BRCA2 testing. BRCA-related cancers include these types: ? Breast. This occurs in males or females. ? Ovarian. ? Tubal. This may also be called fallopian tube cancer. ? Cancer of the abdominal or pelvic lining (peritoneal cancer). ? Prostate. ? Pancreatic.  Cervical, Uterine, and Ovarian Cancer Your health care provider may recommend that you be screened regularly for cancer of the pelvic organs. These include your ovaries, uterus, and vagina. This screening involves a pelvic exam, which includes checking for microscopic changes to the surface of your cervix (Pap test).  For women ages 21-65, health care providers may recommend a pelvic exam and a Pap test every three years. For women ages 9-65, they may recommend the Pap test and pelvic exam, combined with testing for human papilloma virus (HPV), every five years. Some types of HPV increase your risk of cervical cancer. Testing for HPV may also be done on women of any age who have unclear Pap test results.  Other health care providers may not recommend any screening for nonpregnant women who are considered low risk for pelvic cancer and have no symptoms. Ask your health care provider if a screening pelvic exam is right for you.  If you have had past treatment for cervical cancer or a  condition that could lead to cancer, you need Pap tests and screening for cancer for at least 20 years after your treatment. If Pap tests have been discontinued for you, your risk factors (such as having a new sexual partner) need to be reassessed to determine if you should start having screenings again. Some women have medical problems that increase the chance of getting cervical cancer. In these cases, your health care provider may recommend that you have screening and Pap tests more often.  If you have a family history of uterine cancer or ovarian cancer, talk with your health care provider about genetic screening.  If you have vaginal bleeding after reaching menopause, tell your health care provider.  There are currently no reliable tests available to screen for ovarian cancer.  Lung Cancer Lung cancer screening is recommended for adults 5-20 years old who are at high risk for lung cancer because of a history of smoking. A yearly low-dose CT scan of the lungs is recommended if you:  Currently smoke.  Have a history of at least 30 pack-years of smoking and you currently smoke or have quit within the past 15 years. A pack-year is smoking an average of one pack of cigarettes per day for one year.  Yearly screening should:  Continue until it has been 15 years since you quit.  Stop if you develop  a health problem that would prevent you from having lung cancer treatment.  Colorectal Cancer  This type of cancer can be detected and can often be prevented.  Routine colorectal cancer screening usually begins at age 60 and continues through age 89.  If you have risk factors for colon cancer, your health care provider may recommend that you be screened at an earlier age.  If you have a family history of colorectal cancer, talk with your health care provider about genetic screening.  Your health care provider may also recommend using home test kits to check for hidden blood in your stool.  A  small camera at the end of a tube can be used to examine your colon directly (sigmoidoscopy or colonoscopy). This is done to check for the earliest forms of colorectal cancer.  Direct examination of the colon should be repeated every 5-10 years until age 74. However, if early forms of precancerous polyps or small growths are found or if you have a family history or genetic risk for colorectal cancer, you may need to be screened more often.  Skin Cancer  Check your skin from head to toe regularly.  Monitor any moles. Be sure to tell your health care provider: ? About any new moles or changes in moles, especially if there is a change in a mole's shape or color. ? If you have a mole that is larger than the size of a pencil eraser.  If any of your family members has a history of skin cancer, especially at a young age, talk with your health care provider about genetic screening.  Always use sunscreen. Apply sunscreen liberally and repeatedly throughout the day.  Whenever you are outside, protect yourself by wearing long sleeves, pants, a wide-brimmed hat, and sunglasses.  What should I know about osteoporosis? Osteoporosis is a condition in which bone destruction happens more quickly than new bone creation. After menopause, you may be at an increased risk for osteoporosis. To help prevent osteoporosis or the bone fractures that can happen because of osteoporosis, the following is recommended:  If you are 70-45 years old, get at least 1,000 mg of calcium and at least 600 mg of vitamin D per day.  If you are older than age 41 but younger than age 59, get at least 1,200 mg of calcium and at least 600 mg of vitamin D per day.  If you are older than age 96, get at least 1,200 mg of calcium and at least 800 mg of vitamin D per day.  Smoking and excessive alcohol intake increase the risk of osteoporosis. Eat foods that are rich in calcium and vitamin D, and do weight-bearing exercises several times  each week as directed by your health care provider. What should I know about how menopause affects my mental health? Depression may occur at any age, but it is more common as you become older. Common symptoms of depression include:  Low or sad mood.  Changes in sleep patterns.  Changes in appetite or eating patterns.  Feeling an overall lack of motivation or enjoyment of activities that you previously enjoyed.  Frequent crying spells.  Talk with your health care provider if you think that you are experiencing depression. What should I know about immunizations? It is important that you get and maintain your immunizations. These include:  Tetanus, diphtheria, and pertussis (Tdap) booster vaccine.  Influenza every year before the flu season begins.  Pneumonia vaccine.  Shingles vaccine.  Your health care provider may  also recommend other immunizations. Attempt to taper and discontinue alprazolam to use as needed only  Attempt to taper, omeprazole and use as needed only  Take a calcium supplement, plus [phone] units of vitamin D

## 2017-04-03 NOTE — Addendum Note (Signed)
Addended by: Abelardo Diesel on: 04/03/2017 12:58 PM   Modules accepted: Orders

## 2017-04-03 NOTE — Progress Notes (Signed)
Subjective:    Patient ID: Tonya Cherry, female    DOB: 05/24/1951, 66 y.o.   MRN: [id number]  HPI 66 year old patient who is seen today for an annual exam and initial Medicare wellness visit  She has a history of dyslipidemia and ongoing tobacco use.  A bone density study was reviewed from 2000 and did reveal a T score of -2.7 involving the left hip. No colonoscopy.  No recent mammogram but she has scheduled a follow-up OB/GYN visit She has a history of carotid artery stenosis and remains asymptomatic.  No recent carotid artery Doppler studies  Past Medical History:  Diagnosis Date  . Depression   . GERD (gastroesophageal reflux disease)   . Hyperlipidemia   . Thyroid disease   . Ulcer      Social History   Social History  . Marital status: Married    Spouse name: N/A  . Number of children: N/A  . Years of education: N/A   Occupational History  . Not on file.   Social History Main Topics  . Smoking status: Current Every Day Smoker    Packs/day: 1.00    Types: Cigarettes  . Smokeless tobacco: Never Used  . Alcohol use No  . Drug use: No  . Sexual activity: Not on file   Other Topics Concern  . Not on file   Social History Narrative  . No narrative on file    Past Surgical History:  Procedure Laterality Date  . CESAREAN SECTION      Family History  Problem Relation Age of Onset  . Heart disease Mother   . Stroke Mother   . Alcohol abuse Father   . Cancer Maternal Grandmother        colon    Allergies  Allergen Reactions  . Penicillins Cross Reactors Rash  . Sulfa Antibiotics Rash  . Other Diarrhea    "mycin"     No current outpatient prescriptions on file prior to visit.   No current facility-administered medications on file prior to visit.     BP 110/66 (BP Location: Left Arm, Patient Position: Sitting, Cuff Size: Normal)   Pulse 89   Temp 98.5 F (36.9 C) (Oral)   Ht 5\' 4"  (1.626 m)   Wt 151 lb (68.5 kg)   SpO2 97%   BMI 25.92  kg/m   Medicare wellness visit  1. Risk factors, based on past  M,S,F history.  Cardiovascular risk factors include a history of peripheral vascular disease and dyslipidemia and ongoing tobacco use  2.  Physical activities:remains fairly active with the walking  3.  Depression/mood:no history of major depression.  Does take alprazolam "to prevent panic attacks"  4.  Hearing:no deficits  5.  ADL's:independent  6.  Fall risk:low  7.  Home safety:no problems identified  8.  Height weight, and visual acuity;height and weight stable no change in visual acuity  9.  Counseling:total smoking cessation encouraged.  Calcium and vitamin D supplements encouraged  10. Lab orders based on risk factors:we'll check follow-up carotid artery Doppler studies.  Bone density and schedule for a colonoscopy.  We'll check screening lab including lipid profile and TSH  11. Referral :dermatology and OB/GYN referral.  GI referral for colonoscopy  12. Care plan:continue efforts at aggressive risk factor modification  13. Cognitive assessment: alert and normal with normal affect no cognitive dysfunction  14. Screening: Patient provided with a written and personalized 5-10 year screening schedule in the AVS.    15.  Provider List Update: dermatology primary care GI and radiology, as well as gynecology  16.  Advance directives.  No living will or health care power of attorney in place    Review of Systems  Constitutional: Negative.   HENT: Negative for congestion, dental problem, hearing loss, rhinorrhea, sinus pressure, sore throat and tinnitus.   Eyes: Negative for pain, discharge and visual disturbance.  Respiratory: Negative for cough and shortness of breath.   Cardiovascular: Negative for chest pain, palpitations and leg swelling.  Gastrointestinal: Negative for abdominal distention, abdominal pain, blood in stool, constipation, diarrhea, nausea and vomiting.  Genitourinary: Negative for  difficulty urinating, dysuria, flank pain, frequency, hematuria, pelvic pain, urgency, vaginal bleeding, vaginal discharge and vaginal pain.  Musculoskeletal: Negative for arthralgias, gait problem and joint swelling.  Skin: Positive for wound. Negative for rash.  Neurological: Negative for dizziness, syncope, speech difficulty, weakness, numbness and headaches.  Hematological: Negative for adenopathy.  Psychiatric/Behavioral: Negative for agitation, behavioral problems and dysphoric mood. The patient is not nervous/anxious.        Objective:   Physical Exam  Constitutional: She is oriented to person, place, and time. She appears well-developed and well-nourished.  HENT:  Head: Normocephalic and atraumatic.  Right Ear: External ear normal.  Left Ear: External ear normal.  Mouth/Throat: Oropharynx is clear and moist.  Upper dentures in place  Eyes: Conjunctivae and EOM are normal.  Neck: Normal range of motion. Neck supple. No JVD present. No thyromegaly present.  Cardiovascular: Normal rate, regular rhythm, normal heart sounds and intact distal pulses.   No murmur heard. Left dorsalis pedis pulses not easily palpable  Pulmonary/Chest: Effort normal and breath sounds normal. She has no wheezes. She has no rales.  Abdominal: Soft. Bowel sounds are normal. She exhibits no distension and no mass. There is no tenderness. There is no rebound and no guarding.  Musculoskeletal: Normal range of motion. She exhibits no edema or tenderness.  Neurological: She is alert and oriented to person, place, and time. She has normal reflexes. No cranial nerve deficit. She exhibits normal muscle tone. Coordination normal.  Skin: Skin is warm and dry. No rash noted.  15 cm slightly raised, scaly, pink colored plaque involving her anterior mid chest area  Psychiatric: She has a normal mood and affect. Her behavior is normal.          Assessment & Plan:   Preventive health examination Initial Medicare  wellness visit History of left hip.  Osteoporosis.  We'll start calcium and vitamin D supplements.  Check follow-up bone density study, likely will need treatment Schedule colonoscopy Follow-up OB/GYN  Total smoking cessation encouraged Review lipid profile.  Continue statin therapy  We'll taper and discontinue alprazolam and use when necessary only  [employer]

## 2017-04-04 LAB — HEPATITIS C ANTIBODY
Hepatitis C Ab: NONREACTIVE
SIGNAL TO CUT-OFF: 0.01 (ref <1.00)

## 2017-04-16 ENCOUNTER — Ambulatory Visit (HOSPITAL_COMMUNITY)
Admission: RE | Admit: 2017-04-16 | Payer: PPO | Source: Ambulatory Visit | Attending: Internal Medicine | Admitting: Internal Medicine

## 2017-04-23 ENCOUNTER — Encounter: Payer: BLUE CROSS/BLUE SHIELD | Admitting: Internal Medicine

## 2017-04-26 ENCOUNTER — Other Ambulatory Visit: Payer: BLUE CROSS/BLUE SHIELD

## 2017-04-26 ENCOUNTER — Ambulatory Visit (HOSPITAL_COMMUNITY)
Admission: RE | Admit: 2017-04-26 | Discharge: 2017-04-26 | Disposition: A | Discharge status: Home / Self Care | Payer: PPO | Source: Ambulatory Visit | Attending: Internal Medicine | Admitting: Internal Medicine

## 2017-04-26 DIAGNOSIS — I6529 Occlusion and stenosis of unspecified carotid artery: Secondary | ICD-10-CM | POA: Insufficient documentation

## 2017-05-03 ENCOUNTER — Encounter: Payer: BLUE CROSS/BLUE SHIELD | Admitting: Internal Medicine

## 2017-05-24 ENCOUNTER — Other Ambulatory Visit: Payer: Self-pay | Admitting: Internal Medicine

## 2017-06-20 ENCOUNTER — Other Ambulatory Visit: Payer: Self-pay | Admitting: Internal Medicine

## 2017-07-18 ENCOUNTER — Telehealth: Payer: Self-pay | Admitting: Internal Medicine

## 2017-07-18 MED ORDER — ALPRAZOLAM 0.5 MG PO TABS: 0.5000 mg | ORAL_TABLET | Freq: Three times a day (TID) | ORAL | 0 refills | Status: DC | PRN

## 2017-07-19 NOTE — Telephone Encounter (Signed)
Okay for refill?  

## 2017-07-19 NOTE — Telephone Encounter (Addendum)
Patient checking status, please advise. Patient states she called early refill as she was told to do so she will not run out. Please advise Call back [phone]

## 2017-07-19 NOTE — Telephone Encounter (Signed)
Xanax refill 

## 2017-07-20 MED ORDER — ALPRAZOLAM 0.5 MG PO TABS: 0.5000 mg | ORAL_TABLET | Freq: Three times a day (TID) | ORAL | 0 refills | Status: DC | PRN

## 2017-07-20 NOTE — Telephone Encounter (Signed)
PEC called to advise pt called to check on rx as pharmacy did not have yet. Rx was set to "print" and was not sent to pharmacy. Called pharmacy and did verbal order. PEC advised pt this was being done. Nothing further needed.

## 2017-07-20 NOTE — Addendum Note (Signed)
Addended by: Beckie Busing on: 07/20/2017 04:05 PM   Modules accepted: Orders

## 2017-07-20 NOTE — Telephone Encounter (Signed)
Medication filled to pharmacy as requested by Adela Lank, LPN.

## 2017-09-11 ENCOUNTER — Other Ambulatory Visit: Payer: Self-pay | Admitting: Internal Medicine

## 2017-10-09 ENCOUNTER — Other Ambulatory Visit: Payer: Self-pay | Admitting: Internal Medicine

## 2017-11-06 ENCOUNTER — Other Ambulatory Visit: Payer: Self-pay | Admitting: Internal Medicine

## 2017-11-07 ENCOUNTER — Other Ambulatory Visit: Payer: Self-pay | Admitting: Internal Medicine

## 2017-11-08 ENCOUNTER — Other Ambulatory Visit: Payer: Self-pay | Admitting: Internal Medicine

## 2017-11-09 NOTE — Telephone Encounter (Signed)
Copied from [place] [id number]. Topic: Quick Communication - Rx Refill/Question >> Nov 09, 2017  4:09 PM Tye [place] wrote: Medication: ALPRAZolam Duanne Moron) 0.5 MG tablet [[id number]]   Has the patient contacted their pharmacy? Yes.   (Agent: If no, request that the patient contact the pharmacy for the refill.) (Agent: If yes, when and what did the pharmacy advise?)  [facility]erred [facility] (with phone number [facility]  Agent: Please be advised that RX refills may take up to 3 business days. We ask that you follow-up with your pharmacy.

## 2017-11-09 NOTE — Telephone Encounter (Signed)
Patient checking status. Please advise. Call back [phone]

## 2017-11-12 NOTE — Telephone Encounter (Signed)
Xanax 0.5 mg refill request  LOV 04/03/17 with Dr. Burnice Logan  Last refill:  10/10/17  #60  0 refills  [id number] [place] [place] - [address]

## 2017-12-10 ENCOUNTER — Other Ambulatory Visit: Payer: Self-pay | Admitting: Internal Medicine

## 2017-12-11 NOTE — Telephone Encounter (Signed)
Okay for refill?  

## 2017-12-11 NOTE — Telephone Encounter (Signed)
Patient need to schedule an ov for more refills. Pt has not been seen since 03/2017. Pt states that Dr.kwiatkowski normally fills her Rx for a year. I informed pt that she has to been seen At least every 6 months for more refills. She is asking if one more Rx can be sent in in hopes that when they are due next time, she will have insurance.

## 2017-12-12 ENCOUNTER — Other Ambulatory Visit: Payer: Self-pay | Admitting: Internal Medicine

## 2017-12-13 NOTE — Telephone Encounter (Signed)
Rx/forms faxed. Fax confirmation received.  

## 2018-01-15 ENCOUNTER — Ambulatory Visit (INDEPENDENT_AMBULATORY_CARE_PROVIDER_SITE_OTHER): Payer: PPO | Admitting: Internal Medicine

## 2018-01-15 ENCOUNTER — Encounter: Payer: Self-pay | Admitting: Internal Medicine

## 2018-01-15 VITALS — BP 124/62 | HR 91 | Temp 98.2°F | Wt 151.4 lb

## 2018-01-15 DIAGNOSIS — I6529 Occlusion and stenosis of unspecified carotid artery: Secondary | ICD-10-CM | POA: Diagnosis not present

## 2018-01-15 DIAGNOSIS — E039 Hypothyroidism, unspecified: Secondary | ICD-10-CM

## 2018-01-15 DIAGNOSIS — Z720 Tobacco use: Secondary | ICD-10-CM

## 2018-01-15 DIAGNOSIS — E785 Hyperlipidemia, unspecified: Secondary | ICD-10-CM

## 2018-01-15 MED ORDER — LEVOTHYROXINE SODIUM 75 MCG PO TABS: 75.0000 ug | ORAL_TABLET | Freq: Every day | ORAL | 3 refills | Status: DC

## 2018-01-15 MED ORDER — ALPRAZOLAM 0.5 MG PO TABS: 0.5000 mg | ORAL_TABLET | Freq: Two times a day (BID) | ORAL | 0 refills | Status: DC | PRN

## 2018-01-15 MED ORDER — ATORVASTATIN CALCIUM 20 MG PO TABS: 20.0000 mg | ORAL_TABLET | Freq: Every day | ORAL | 5 refills | Status: DC

## 2018-01-15 MED ORDER — ALPRAZOLAM 0.5 MG PO TABS: 0.5000 mg | ORAL_TABLET | Freq: Three times a day (TID) | ORAL | 0 refills | Status: DC | PRN

## 2018-01-15 MED ORDER — OMEPRAZOLE 20 MG PO CPDR: 20.0000 mg | DELAYED_RELEASE_CAPSULE | Freq: Every day | ORAL | 2 refills | Status: DC

## 2018-01-15 NOTE — Patient Instructions (Addendum)
Limit your sodium (Salt) intake    It is important that you exercise regularly, at least 20 minutes 3 to 4 times per week.  If you develop chest pain or shortness of breath seek  medical attention.  Smoking tobacco is very bad for your health. You should stop smoking immediately.    Attempt to minimize alprazolam use.  Consider decreasing the dose to 1/2 tablet twice daily for a period of time and then using when necessary only    Return in 6 months for follow-up

## 2018-01-15 NOTE — Progress Notes (Signed)
Subjective:    Patient ID: Tonya Cherry, female    DOB: [birth year], 67 y.o.   MRN: [id number]  HPI  67 year old patient who is seen today for follow-up. She has history of carotid artery stenosis.  She initially had carotid artery studies performed in November 2013 when noted on clinical exam to have a decreased left carotid upstroke.  Studies revealed a bilateral 60 to 79% bilateral carotid artery stenosis. She was placed on statin therapy at that time which she has taken only sporadically.  She continues to smoke.  In spite of these ongoing risk factors, serial carotid Doppler studies over the years have showed progressively less stenoses.  Her last study in November 2018 revealed a 1-39% stenosis on the left and no stenosis involving the right carotid artery. Patient has a history of dyslipidemia and initially was prescribed atorvastatin in 2013.  At the time of her last examination LDL cholesterol was greater than 190.  She states that she still is not on statin therapy on a regular basis.  She has treated hypothyroidism.  She has a history of anxiety disorder and does use alprazolam Twice daily PRN  Colonoscopy was ordered at the time of her last exam.  Patient did not follow through but she states that she had Cologuard testing performed with a negative result.  She continues to smoke.  No low-dose chest CT scanning.  Patient will consider    Past Medical History:  Diagnosis Date  . Depression   . GERD (gastroesophageal reflux disease)   . Hyperlipidemia   . Thyroid disease   . Ulcer      Social History   Socioeconomic History  . Marital status: Married    Spouse name: Not on file  . Number of children: Not on file  . Years of education: Not on file  . Highest education level: Not on file  Occupational History  . Not on file  Social Needs  . Financial resource strain: Not on file  . Food insecurity:    Worry: Not on file    Inability: Not on file  .  Transportation needs:    Medical: Not on file    Non-medical: Not on file  Tobacco Use  . Smoking status: Current Every Day Smoker    Packs/day: 1.00    Types: Cigarettes  . Smokeless tobacco: Never Used  Substance and Sexual Activity  . Alcohol use: No  . Drug use: No  . Sexual activity: Not on file  Lifestyle  . Physical activity:    Days per week: Not on file    Minutes per session: Not on file  . Stress: Not on file  Relationships  . Social connections:    Talks on phone: Not on file    Gets together: Not on file    Attends religious service: Not on file    Active member of club or organization: Not on file    Attends meetings of clubs or organizations: Not on file    Relationship status: Not on file  . Intimate partner violence:    Fear of current or ex partner: Not on file    Emotionally abused: Not on file    Physically abused: Not on file    Forced sexual activity: Not on file  Other Topics Concern  . Not on file  Social History Narrative  . Not on file    Past Surgical History:  Procedure Laterality Date  . CESAREAN SECTION  Family History  Problem Relation Age of Onset  . Heart disease Mother   . Stroke Mother   . Alcohol abuse Father   . Cancer Maternal Grandmother        colon    Allergies  Allergen Reactions  . Penicillins Cross Reactors Rash  . Sulfa Antibiotics Rash  . Other Diarrhea    "mycin"     No current outpatient medications on file prior to visit.   No current facility-administered medications on file prior to visit.     BP 124/62 (BP Location: Right Arm, Patient Position: Sitting, Cuff Size: Normal)   Pulse 91   Temp 98.2 F (36.8 C) (Oral)   Wt 151 lb 6.4 oz (68.7 kg)   SpO2 97%   BMI 25.99 kg/m     Review of Systems  Constitutional: Negative.   HENT: Negative for congestion, dental problem, hearing loss, rhinorrhea, sinus pressure, sore throat and tinnitus.   Eyes: Negative for pain, discharge and visual  disturbance.  Respiratory: Negative for cough and shortness of breath.   Cardiovascular: Negative for chest pain, palpitations and leg swelling.  Gastrointestinal: Negative for abdominal distention, abdominal pain, blood in stool, constipation, diarrhea, nausea and vomiting.  Genitourinary: Negative for difficulty urinating, dysuria, flank pain, frequency, hematuria, pelvic pain, urgency, vaginal bleeding, vaginal discharge and vaginal pain.  Musculoskeletal: Negative for arthralgias, gait problem and joint swelling.  Skin: Negative for rash.  Neurological: Negative for dizziness, syncope, speech difficulty, weakness, numbness and headaches.  Hematological: Negative for adenopathy.  Psychiatric/Behavioral: Negative for agitation, behavioral problems and dysphoric mood. The patient is not nervous/anxious.        Objective:   Physical Exam  Constitutional: She is oriented to person, place, and time. She appears well-developed and well-nourished.  HENT:  Head: Normocephalic.  Right Ear: External ear normal.  Left Ear: External ear normal.  Mouth/Throat: Oropharynx is clear and moist.  Upper dentures in place  Eyes: Pupils are equal, round, and reactive to light. Conjunctivae and EOM are normal.  Neck: Normal range of motion. Neck supple. No thyromegaly present.  Cardiovascular: Normal rate, regular rhythm, normal heart sounds and intact distal pulses.  Pulmonary/Chest: Effort normal and breath sounds normal.  Abdominal: Soft. Bowel sounds are normal. She exhibits no mass. There is no tenderness.  Musculoskeletal: Normal range of motion.  Lymphadenopathy:    She has no cervical adenopathy.  Neurological: She is alert and oriented to person, place, and time.  Skin: Skin is warm and dry. No rash noted.  Psychiatric: She has a normal mood and affect. Her behavior is normal.          Assessment & Plan:   History of carotid artery stenosis.  Patient has shown surprising regression  over the years in spite of lack of risk factor modification.  Last carotid artery Doppler study revealed 1-39% left carotid stenosis and no stenosis on the right Dyslipidemia.  Last LDL cholesterol greater than 190.  Atorvastatin refilled.  Compliance stressed Ongoing tobacco use.  Total smoking cessation encouraged.  Will encourage low-dose chest CT screening at next annual exam Hypothyroidism Anxiety disorder.  Alprazolam refilled  Marletta Lor

## 2018-02-11 ENCOUNTER — Other Ambulatory Visit: Payer: Self-pay | Admitting: Internal Medicine

## 2018-02-12 ENCOUNTER — Other Ambulatory Visit: Payer: Self-pay | Admitting: Internal Medicine

## 2018-03-12 ENCOUNTER — Other Ambulatory Visit: Payer: Self-pay | Admitting: Internal Medicine

## 2018-03-13 NOTE — Telephone Encounter (Signed)
Last refill 02/12/18 Last office visit 01/15/18

## 2018-04-11 ENCOUNTER — Other Ambulatory Visit: Payer: Self-pay | Admitting: Internal Medicine

## 2018-04-12 NOTE — Telephone Encounter (Signed)
Okay for refill? Please advise 

## 2018-04-15 ENCOUNTER — Other Ambulatory Visit: Payer: Self-pay | Admitting: Internal Medicine

## 2018-04-16 NOTE — Telephone Encounter (Unsigned)
Copied from [place] [id number]. Topic: Quick Communication - Rx Refill/Question >> Apr 16, 2018  9:35 AM Carolyn Stare wrote: Medication   ALPRAZolam Duanne Moron) 0.5 MG tablet     showing rx printed but pharmacy does not have    Has the patient contacted their pharmacy yes      Preferred Pharmacy  Agent: Please be advised that RX refills may take up to 3 business days. We ask that you follow-up with your pharmacy.

## 2018-04-19 ENCOUNTER — Other Ambulatory Visit: Payer: Self-pay

## 2018-05-02 ENCOUNTER — Telehealth: Payer: Self-pay | Admitting: Internal Medicine

## 2018-05-02 NOTE — Telephone Encounter (Signed)
Copied from [place] [id number]. Topic: Quick Communication - Rx Refill/Question >> May 02, 2018  9:19 AM Antonieta Iba C wrote: Medication: ALPRAZolam Duanne Moron) 0.5 MG tablet   Has the patient contacted their pharmacy? Yes  (Agent: If no, request that the patient contact the pharmacy for the refill.) (Agent: If yes, when and what did the pharmacy advise?)  Preferred Pharmacy (with phone number or street name): [id number] - [place], [place] - [address] [phone] (Phone) [phone] (Fax)  Pt is scheduled to [facility] In December.     Agent: Please be advised that RX refills may take up to 3 business days. We ask that you follow-up with your pharmacy.

## 2018-05-13 ENCOUNTER — Other Ambulatory Visit: Payer: Self-pay | Admitting: Family Medicine

## 2018-05-14 NOTE — Telephone Encounter (Signed)
Message routed to [place] for approval!

## 2018-05-14 NOTE — Telephone Encounter (Signed)
Okay for refill? Please advise Pt has an appt 05/30/2018

## 2018-05-14 NOTE — Telephone Encounter (Signed)
Ok to refill only enough quantity to last until next appointment

## 2018-05-16 ENCOUNTER — Other Ambulatory Visit: Payer: Self-pay | Admitting: Family Medicine

## 2018-05-16 ENCOUNTER — Telehealth: Payer: Self-pay

## 2018-05-16 MED ORDER — ALPRAZOLAM 0.5 MG PO TABS: 0.5000 mg | ORAL_TABLET | Freq: Two times a day (BID) | ORAL | 0 refills | Status: DC | PRN

## 2018-05-16 NOTE — Addendum Note (Signed)
Addended by: Westley Hummer B on: 05/16/2018 04:43 PM   Modules accepted: Orders

## 2018-05-16 NOTE — Telephone Encounter (Signed)
Patient calling and would like to know why her medication had not been sent yet. States that she was told yesterday that Dr Jerilee Hoh approved enough medication to last until her appointment on 05/30/18. Please advise.

## 2018-05-17 NOTE — Telephone Encounter (Signed)
Called in.

## 2018-05-30 ENCOUNTER — Encounter: Payer: Self-pay | Admitting: Internal Medicine

## 2018-05-30 ENCOUNTER — Ambulatory Visit (INDEPENDENT_AMBULATORY_CARE_PROVIDER_SITE_OTHER): Payer: PPO | Admitting: Internal Medicine

## 2018-05-30 VITALS — BP 130/80 | HR 98 | Temp 98.6°F | Ht 64.0 in | Wt 147.6 lb

## 2018-05-30 DIAGNOSIS — E785 Hyperlipidemia, unspecified: Secondary | ICD-10-CM

## 2018-05-30 DIAGNOSIS — Z23 Encounter for immunization: Secondary | ICD-10-CM | POA: Diagnosis not present

## 2018-05-30 DIAGNOSIS — Z720 Tobacco use: Secondary | ICD-10-CM

## 2018-05-30 DIAGNOSIS — I6529 Occlusion and stenosis of unspecified carotid artery: Secondary | ICD-10-CM | POA: Diagnosis not present

## 2018-05-30 DIAGNOSIS — E559 Vitamin D deficiency, unspecified: Secondary | ICD-10-CM

## 2018-05-30 DIAGNOSIS — E039 Hypothyroidism, unspecified: Secondary | ICD-10-CM | POA: Diagnosis not present

## 2018-05-30 DIAGNOSIS — F419 Anxiety disorder, unspecified: Secondary | ICD-10-CM | POA: Diagnosis not present

## 2018-05-30 DIAGNOSIS — M810 Age-related osteoporosis without current pathological fracture: Secondary | ICD-10-CM

## 2018-05-30 LAB — VITAMIN D 25 HYDROXY (VIT D DEFICIENCY, FRACTURES): VITD: 13.93 ng/mL — AB (ref 30.00–100.00)

## 2018-05-30 MED ORDER — RISEDRONATE SODIUM 35 MG PO TABS: 35.0000 mg | ORAL_TABLET | ORAL | 3 refills | Status: DC

## 2018-05-30 MED ORDER — ASPIRIN EC 81 MG PO TBEC: 81.0000 mg | DELAYED_RELEASE_TABLET | Freq: Every day | ORAL | 11 refills | Status: DC

## 2018-05-30 MED ORDER — ALPRAZOLAM 0.5 MG PO TABS: ORAL_TABLET | ORAL | 0 refills | Status: DC

## 2018-05-30 MED ORDER — ATORVASTATIN CALCIUM 20 MG PO TABS: 20.0000 mg | ORAL_TABLET | Freq: Every day | ORAL | 3 refills | Status: DC

## 2018-05-30 NOTE — Patient Instructions (Signed)
-  It was nice meeting you today!  -Vitamin D level to be drawn today, will let you know results.  -We will send you home with Cologuard kit to complete for colon cancer screening.  -Second pneumonia shot today.  -Please make sure you schedule your annual mammogram.  -Start taking risedronate 35 mg weekly for osteoporosis.  -Start taking atorvastatin 20 mg daily for your high cholesterol in addition to a baby aspirin 81 mg daily.    -Continue efforts at smoking cessation.  -Please schedule follow-up with me in 3 months for your annual wellness visit, come fasting at that time.

## 2018-05-30 NOTE — Progress Notes (Signed)
Established Patient Office Visit     CC/Reason for Visit: To establish care and follow-up on chronic medical conditions  HPI: Tonya Cherry is a 67 y.o. female who is coming in today for the above mentioned reasons.  She is past due for her annual physical.  Past Medical History is significant for: Hyperlipidemia, carotid artery disease that has surprisingly regressed despite lack of risk factor modification, continued tobacco abuse, hypothyroidism, anxiety disorder on twice daily alprazolam, osteoporosis with a T score of -2.7 in 2009 (not on bisphosphonate therapy).  She is here to establish care with me today, has no acute complaints, would like refills of her medications.  She has not been taking her atorvastatin, her last LDL was 190, she is not taking low-dose aspirin.  Her hypothyroidism has been well controlled with the most recent TSH of 2.48 in October 2018.   Past Medical/Surgical History: Past Medical History:  Diagnosis Date  . Depression   . GERD (gastroesophageal reflux disease)   . Hyperlipidemia   . Thyroid disease   . Ulcer     Past Surgical History:  Procedure Laterality Date  . CESAREAN SECTION      Social History:  reports that she has been smoking cigarettes. She has been smoking about 1.00 pack per day. She has never used smokeless tobacco. She reports that she does not drink alcohol or use drugs.  Allergies: Allergies  Allergen Reactions  . Penicillins Cross Reactors Rash  . Sulfa Antibiotics Rash  . Other Diarrhea    "mycin"     Family History:  Family History  Problem Relation Age of Onset  . Heart disease Mother   . Stroke Mother   . Alcohol abuse Father   . Cancer Maternal Grandmother        colon     Current Outpatient Medications:  .  ALPRAZolam (XANAX) 0.5 MG tablet, Take 1 tablet twice daily as needed for severe anxiety, Disp: 60 tablet, Rfl: 0 .  levothyroxine (SYNTHROID, LEVOTHROID) 75 MCG tablet, Take 1 tablet (75 mcg  total) by mouth daily., Disp: 90 tablet, Rfl: 3 .  omeprazole (PRILOSEC) 20 MG capsule, Take 1 capsule (20 mg total) by mouth daily., Disp: 90 capsule, Rfl: 2 .  aspirin EC 81 MG tablet, Take 1 tablet (81 mg total) by mouth daily., Disp: 90 tablet, Rfl: 11 .  atorvastatin (LIPITOR) 20 MG tablet, Take 1 tablet (20 mg total) by mouth daily., Disp: 90 tablet, Rfl: 3 .  risedronate (ACTONEL) 35 MG tablet, Take 1 tablet (35 mg total) by mouth every 7 (seven) days. with water on empty stomach, nothing by mouth or lie down for next 30 minutes., Disp: 12 tablet, Rfl: 3  Review of Systems:  Constitutional: Denies fever, chills, diaphoresis, appetite change and fatigue.  HEENT: Denies photophobia, eye pain, redness, hearing loss, ear pain, congestion, sore throat, rhinorrhea, sneezing, mouth sores, trouble swallowing, neck pain, neck stiffness and tinnitus.   Respiratory: Denies SOB, DOE, cough, chest tightness,  and wheezing.   Cardiovascular: Denies chest pain, palpitations and leg swelling.  Gastrointestinal: Denies nausea, vomiting, abdominal pain, diarrhea, constipation, blood in stool and abdominal distention.  Genitourinary: Denies dysuria, urgency, frequency, hematuria, flank pain and difficulty urinating.  Endocrine: Denies: hot or cold intolerance, sweats, changes in hair or nails, polyuria, polydipsia. Musculoskeletal: Denies myalgias, back pain, joint swelling, arthralgias and gait problem.  Skin: Denies pallor, rash and wound.  Neurological: Denies dizziness, seizures, syncope, weakness, light-headedness, numbness and headaches.  Hematological: Denies adenopathy. Easy bruising, personal or family bleeding history  Psychiatric/Behavioral: Denies suicidal ideation, mood changes, confusion, nervousness, sleep disturbance and agitation    Physical Exam: Vitals:   05/30/18 1008  BP: 130/80  Pulse: 98  Temp: 98.6 F (37 C)  TempSrc: Oral  SpO2: 95%  Weight: 147 lb 9.6 oz (67 kg)  Height:  _0  (1.626 m)    Body mass index is 25.34 kg/m.   Constitutional: NAD, calm, comfortable Eyes: PERRL, lids and conjunctivae normal ENMT: Mucous membranes are moist. Posterior pharynx clear of any exudate or lesions. Normal dentition.  Neck: normal, supple, no masses, no thyromegaly Respiratory: clear to auscultation bilaterally, no wheezing, no crackles. Normal respiratory effort. No accessory muscle use.  Cardiovascular: Regular rate and rhythm, no murmurs / rubs / gallops. No extremity edema. 2+ pedal pulses. No carotid bruits.  Abdomen: no tenderness, no masses palpated. No hepatosplenomegaly. Bowel sounds positive.  Musculoskeletal: no clubbing / cyanosis. No joint deformity upper and lower extremities. Good ROM, no contractures. Normal muscle tone.  Skin: no rashes, lesions, ulcers. No induration Neurologic: CN 2-12 grossly intact. Sensation intact, DTR normal. Strength 5/5 in all 4.  Psychiatric: Normal judgment and insight. Alert and oriented x 3. Normal mood.    Impression and Plan:  Osteoporosis without current pathological fracture, unspecified osteoporosis type -Per DEXA scan in 2009 her T score at the left hip was -2.7, she was never started on bisphosphonate therapy, she is not taking OTC calcium or vitamin D supplements. -We will start her on risedronate 35 mg weekly (preferred bisphosphonate on her insurance plan), will check vitamin D levels as I suspect she will be vitamin D deficient in which case we will give her high-dose vitamin D.  In the meantime she is instructed to use an over-the-counter vitamin D/calcium supplement with 1200 mg of calcium and at least 800 international units of vitamin D. -Can consider repeat DEXA scan 1 year after initiating bisphosphonate therapy.  Acquired hypothyroidism -Well-controlled, last TSH of 2.48, continue current dose of levothyroxine.  Dyslipidemia  -She has agreed to take Lipitor and 81 mg of aspirin daily.  Have discussed  importance of compliance with these medications especially in light of her carotid artery disease and ongoing tobacco abuse.  Tobacco abuse -I have discussed tobacco cessation with the patient.  I have counseled the patient regarding the negative impacts of continued tobacco use including but not limited to lung cancer, COPD, and cardiovascular disease.  I have discussed alternatives to tobacco and modalities that may help facilitate tobacco cessation including but not limited to biofeedback, hypnosis, and medications.  Total time spent with tobacco counseling was 4 minutes. -I am willing to see patient every 2 weeks if necessary towards her efforts in smoking cessation. -We will discuss low-dose CT scan at time of wellness visit for lung cancer screening. -She has smoked a pack a day for over 40 years.  Stenosis of carotid artery, unspecified laterality -We will consider repeat carotid ultrasound at time of next wellness visit.  Anxiety - Plan: ALPRAZolam (XANAX) 0.5 MG tablet -We have discussed weaning her alprazolam, in light of all the other changes she is making she is hesitant to do this.  We will continue to address at each subsequent visit.    Patient Instructions  -It was nice meeting you today!  -Vitamin D level to be drawn today, will let you know results.  -We will send you home with Cologuard kit to complete for colon cancer  screening.  -Second pneumonia shot today.  -Please make sure you schedule your annual mammogram.  -Start taking risedronate 35 mg weekly for osteoporosis.  -Start taking atorvastatin 20 mg daily for your high cholesterol in addition to a baby aspirin 81 mg daily.    -Continue efforts at smoking cessation.  -Please schedule follow-up with me in 3 months for your annual wellness visit, come fasting at that time.     Lelon Frohlich, MD [facility]

## 2018-05-31 DIAGNOSIS — E559 Vitamin D deficiency, unspecified: Secondary | ICD-10-CM | POA: Insufficient documentation

## 2018-05-31 MED ORDER — VITAMIN D (ERGOCALCIFEROL) 1.25 MG (50000 UNIT) PO CAPS: 50000.0000 [IU] | ORAL_CAPSULE | ORAL | 0 refills | Status: AC

## 2018-05-31 NOTE — Addendum Note (Signed)
Addended by: Lelon Frohlich Y on: 05/31/2018 07:07 AM   Modules accepted: Orders

## 2018-06-01 ENCOUNTER — Encounter: Payer: Self-pay | Admitting: Internal Medicine

## 2018-07-11 DIAGNOSIS — Z1211 Encounter for screening for malignant neoplasm of colon: Secondary | ICD-10-CM | POA: Diagnosis not present

## 2018-07-13 ENCOUNTER — Other Ambulatory Visit: Payer: Self-pay | Admitting: Internal Medicine

## 2018-07-13 DIAGNOSIS — F419 Anxiety disorder, unspecified: Secondary | ICD-10-CM

## 2018-07-15 ENCOUNTER — Telehealth: Payer: Self-pay | Admitting: Internal Medicine

## 2018-07-15 NOTE — Telephone Encounter (Signed)
Already sent to provider via interface on 07/13/18.

## 2018-07-15 NOTE — Telephone Encounter (Signed)
Copied from [place] [id number]. Topic: Quick Communication - Rx Refill/Question >> Jul 15, 2018 12:41 PM Windy Kalata wrote: Medication: ALPRAZolam Duanne Moron) 0.5 MG tablet  Has the patient contacted their pharmacy? No. (Agent: If no, request that the patient contact the pharmacy for the refill.) (Agent: If yes, when and what did the pharmacy advise?)  Preferred Pharmacy (with phone number or street name): [id number] - [place], [place] - [address] [phone] (Phone) [phone] (Fax)    Agent: Please be advised that RX refills may take up to 3 business days. We ask that you follow-up with your pharmacy.

## 2018-07-16 LAB — COLOGUARD: Cologuard: POSITIVE — AB

## 2018-08-07 ENCOUNTER — Other Ambulatory Visit: Payer: Self-pay | Admitting: Internal Medicine

## 2018-08-07 DIAGNOSIS — Z1231 Encounter for screening mammogram for malignant neoplasm of breast: Secondary | ICD-10-CM

## 2018-08-08 ENCOUNTER — Ambulatory Visit
Admission: RE | Admit: 2018-08-08 | Discharge: 2018-08-08 | Disposition: A | Discharge status: Home / Self Care | Payer: PPO | Source: Ambulatory Visit | Attending: Internal Medicine | Admitting: Internal Medicine

## 2018-08-08 DIAGNOSIS — Z1231 Encounter for screening mammogram for malignant neoplasm of breast: Secondary | ICD-10-CM | POA: Diagnosis not present

## 2018-08-27 ENCOUNTER — Encounter: Payer: Self-pay | Admitting: Internal Medicine

## 2018-08-27 ENCOUNTER — Telehealth: Payer: Self-pay | Admitting: *Deleted

## 2018-08-27 DIAGNOSIS — R195 Other fecal abnormalities: Secondary | ICD-10-CM

## 2018-08-27 NOTE — Telephone Encounter (Signed)
Patient has a positive Cologuard result. Result abstracted to chart. Would you like a referral to GI?

## 2018-08-28 NOTE — Telephone Encounter (Signed)
Yes. She needs a colonoscopy. Thanks

## 2018-08-28 NOTE — Telephone Encounter (Signed)
Patient is aware.  Referral placed for GI.

## 2018-08-29 ENCOUNTER — Encounter: Payer: Self-pay | Admitting: Internal Medicine

## 2018-08-29 ENCOUNTER — Ambulatory Visit (INDEPENDENT_AMBULATORY_CARE_PROVIDER_SITE_OTHER): Payer: PPO | Admitting: Internal Medicine

## 2018-08-29 VITALS — BP 110/64 | HR 92 | Temp 98.4°F | Ht 64.0 in | Wt 150.3 lb

## 2018-08-29 DIAGNOSIS — Z720 Tobacco use: Secondary | ICD-10-CM | POA: Diagnosis not present

## 2018-08-29 DIAGNOSIS — E559 Vitamin D deficiency, unspecified: Secondary | ICD-10-CM

## 2018-08-29 DIAGNOSIS — E785 Hyperlipidemia, unspecified: Secondary | ICD-10-CM | POA: Diagnosis not present

## 2018-08-29 DIAGNOSIS — E039 Hypothyroidism, unspecified: Secondary | ICD-10-CM | POA: Diagnosis not present

## 2018-08-29 DIAGNOSIS — F419 Anxiety disorder, unspecified: Secondary | ICD-10-CM

## 2018-08-29 DIAGNOSIS — R7302 Impaired glucose tolerance (oral): Secondary | ICD-10-CM

## 2018-08-29 DIAGNOSIS — I6529 Occlusion and stenosis of unspecified carotid artery: Secondary | ICD-10-CM | POA: Diagnosis not present

## 2018-08-29 DIAGNOSIS — M810 Age-related osteoporosis without current pathological fracture: Secondary | ICD-10-CM

## 2018-08-29 LAB — COMPREHENSIVE METABOLIC PANEL
ALT: 23 U/L (ref 0–35)
AST: 21 U/L (ref 0–37)
Albumin: 4.5 g/dL (ref 3.5–5.2)
Alkaline Phosphatase: 100 U/L (ref 39–117)
BUN: 11 mg/dL (ref 6–23)
CO2: 32 mEq/L (ref 19–32)
Calcium: 11 mg/dL — ABNORMAL HIGH (ref 8.4–10.5)
Chloride: 105 mEq/L (ref 96–112)
Creatinine, Ser: 0.82 mg/dL (ref 0.40–1.20)
GFR: 69.64 mL/min (ref >60.00)
Glucose, Bld: 92 mg/dL (ref 70–99)
Potassium: 5.3 mEq/L — ABNORMAL HIGH (ref 3.5–5.1)
Sodium: 141 mEq/L (ref 135–145)
Total Bilirubin: 0.5 mg/dL (ref 0.2–1.2)
Total Protein: 7 g/dL (ref 6.0–8.3)

## 2018-08-29 LAB — TSH: TSH: 2.06 u[IU]/mL (ref 0.35–4.50)

## 2018-08-29 LAB — CBC WITH DIFFERENTIAL/PLATELET
BASOS PCT: 0.7 % (ref 0.0–3.0)
Basophils Absolute: 0 10*3/uL (ref 0.0–0.1)
Eosinophils Absolute: 0.2 10*3/uL (ref 0.0–0.7)
Eosinophils Relative: 3.5 % (ref 0.0–5.0)
HCT: 43.8 % (ref 36.0–46.0)
Hemoglobin: 15 g/dL (ref 12.0–15.0)
Lymphocytes Relative: 28.8 % (ref 12.0–46.0)
Lymphs Abs: 1.7 10*3/uL (ref 0.7–4.0)
MCHC: 34.4 g/dL (ref 30.0–36.0)
MCV: 93.5 fl (ref 78.0–100.0)
Monocytes Absolute: 0.5 10*3/uL (ref 0.1–1.0)
Monocytes Relative: 7.5 % (ref 3.0–12.0)
NEUTROS ABS: 3.6 10*3/uL (ref 1.4–7.7)
NEUTROS PCT: 59.5 % (ref 43.0–77.0)
PLATELETS: 277 10*3/uL (ref 150.0–400.0)
RBC: 4.68 Mil/uL (ref 3.87–5.11)
RDW: 13.2 % (ref 11.5–15.5)
WBC: 6 10*3/uL (ref 4.0–10.5)

## 2018-08-29 LAB — LIPID PANEL
Cholesterol: 188 mg/dL (ref 0–200)
HDL: 62.6 mg/dL (ref >39.00)
LDL Cholesterol: 99 mg/dL (ref 0–99)
NonHDL: 125.31
Total CHOL/HDL Ratio: 3
Triglycerides: 131 mg/dL (ref 0.0–149.0)
VLDL: 26.2 mg/dL (ref 0.0–40.0)

## 2018-08-29 LAB — VITAMIN D 25 HYDROXY (VIT D DEFICIENCY, FRACTURES): VITD: 53.93 ng/mL (ref 30.00–100.00)

## 2018-08-29 LAB — HEMOGLOBIN A1C: Hgb A1c MFr Bld: 5.9 % (ref 4.6–6.5)

## 2018-08-29 MED ORDER — ALPRAZOLAM 0.5 MG PO TABS: ORAL_TABLET | ORAL | 1 refills | Status: DC

## 2018-08-29 NOTE — Patient Instructions (Signed)
-Nice seeing you today!  -Lab work today; will notify you with results.  -Tetanus at the pharmacy.  -Continue to consider flu vaccine.  -Make sure you have routine eye care.  -Will send you to GYN for cervical cancer screening.  -Referral to GI for colonoscopy has been initiated.  -Schedule follow up in 3 months.   Preventive Care 1 Years and Older, Female Preventive care refers to lifestyle choices and visits with your health care provider that can promote health and wellness. What does preventive care include?  A yearly physical exam. This is also called an annual well check.  Dental exams once or twice a year.  Routine eye exams. Ask your health care provider how often you should have your eyes checked.  Personal lifestyle choices, including: ? Daily care of your teeth and gums. ? Regular physical activity. ? Eating a healthy diet. ? Avoiding tobacco and drug use. ? Limiting alcohol use. ? Practicing safe sex. ? Taking low-dose aspirin every day. ? Taking vitamin and mineral supplements as recommended by your health care provider. What happens during an annual well check? The services and screenings done by your health care provider during your annual well check will depend on your age, overall health, lifestyle risk factors, and family history of disease. Counseling Your health care provider may ask you questions about your:  Alcohol use.  Tobacco use.  Drug use.  Emotional well-being.  Home and relationship well-being.  Sexual activity.  Eating habits.  History of falls.  Memory and ability to understand (cognition).  Work and work Statistician.  Reproductive health.  Screening You may have the following tests or measurements:  Height, weight, and BMI.  Blood pressure.  Lipid and cholesterol levels. These may be checked every 5 years, or more frequently if you are over 38 years old.  Skin check.  Lung cancer screening. You may have this  screening every year starting at age 37 if you have a 30-pack-year history of smoking and currently smoke or have quit within the past 15 years.  Colorectal cancer screening. All adults should have this screening starting at age 36 and continuing until age 32. You will have tests every 1-10 years, depending on your results and the type of screening test. People at increased risk should start screening at an earlier age. Screening tests may include: ? Guaiac-based fecal occult blood testing. ? Fecal immunochemical test (FIT). ? Stool DNA test. ? Virtual colonoscopy. ? Sigmoidoscopy. During this test, a flexible tube with a tiny camera (sigmoidoscope) is used to examine your rectum and lower colon. The sigmoidoscope is inserted through your anus into your rectum and lower colon. ? Colonoscopy. During this test, a long, thin, flexible tube with a tiny camera (colonoscope) is used to examine your entire colon and rectum.  Hepatitis C blood test.  Hepatitis B blood test.  Sexually transmitted disease (STD) testing.  Diabetes screening. This is done by checking your blood sugar (glucose) after you have not eaten for a while (fasting). You may have this done every 1-3 years.  Bone density scan. This is done to screen for osteoporosis. You may have this done starting at age 29.  Mammogram. This may be done every 1-2 years. Talk to your health care provider about how often you should have regular mammograms. Talk with your health care provider about your test results, treatment options, and if necessary, the need for more tests. Vaccines Your health care provider may recommend certain vaccines, such as:  Influenza vaccine. This is recommended every year.  Tetanus, diphtheria, and acellular pertussis (Tdap, Td) vaccine. You may need a Td booster every 10 years.  Varicella vaccine. You may need this if you have not been vaccinated.  Zoster vaccine. You may need this after age 68.  Measles,  mumps, and rubella (MMR) vaccine. You may need at least one dose of MMR if you were born in 1957 or later. You may also need a second dose.  Pneumococcal 13-valent conjugate (PCV13) vaccine. One dose is recommended after age 69.  Pneumococcal polysaccharide (PPSV23) vaccine. One dose is recommended after age 64.  Meningococcal vaccine. You may need this if you have certain conditions.  Hepatitis A vaccine. You may need this if you have certain conditions or if you travel or work in places where you may be exposed to hepatitis A.  Hepatitis B vaccine. You may need this if you have certain conditions or if you travel or work in places where you may be exposed to hepatitis B.  Haemophilus influenzae type b (Hib) vaccine. You may need this if you have certain conditions. Talk to your health care provider about which screenings and vaccines you need and how often you need them. This information is not intended to replace advice given to you by your health care provider. Make sure you discuss any questions you have with your health care provider. Document Released: 07/09/2015 Document Revised: 08/02/2017 Document Reviewed: 04/13/2015 Elsevier Interactive Patient Education  2019 [employer].

## 2018-08-29 NOTE — Progress Notes (Signed)
Established Patient Office Visit     CC/Reason for Visit: Annual physical, follow-up chronic conditions  HPI: Tonya Cherry is a 68 y.o. female who is coming in today for the above mentioned reasons. Past Medical History is significant for: Hyperlipidemia, carotid artery disease that has surprisingly regressed despite lack of risk factor modification, continued tobacco abuse, hypothyroidism, anxiety disorder on twice daily alprazolam, vitamin D deficiency, osteoporosis with a T score of -2.7 in 2009.  Since I last saw her she is not taking her atorvastatin, has not been taking low-dose aspirin.  She has not been taking risedronate due to cost.  She has completed her 12 weeks of high-dose vitamin D and is due for recheck today.  She is still smoking three quarters to a pack a day.  She refuses flu vaccine, is due for tetanus which she agrees to receive, is up-to-date on pneumonia.  She had a mammogram in February that was normal.  Unfortunately her Cologuard was positive and we are in the process of referring her to GI for colonoscopy.  Referral to her GYN is also in process for cervical cancer screening.  She has routine dental care, needs routine eye care.   Past Medical/Surgical History: Past Medical History:  Diagnosis Date  . Depression   . GERD (gastroesophageal reflux disease)   . Hyperlipidemia   . Thyroid disease   . Ulcer     Past Surgical History:  Procedure Laterality Date  . CESAREAN SECTION      Social History:  reports that she has been smoking cigarettes. She has been smoking about 1.00 pack per day. She has never used smokeless tobacco. She reports that she does not drink alcohol or use drugs.  Allergies: Allergies  Allergen Reactions  . Penicillins Cross Reactors Rash  . Sulfa Antibiotics Rash  . Other Diarrhea    "mycin"     Family History:  Family History  Problem Relation Age of Onset  . Heart disease Mother   . Stroke Mother   . Alcohol  abuse Father   . Cancer Maternal Grandmother        colon  . Breast cancer Neg Hx      Current Outpatient Medications:  .  ALPRAZolam (XANAX) 0.5 MG tablet, TAKE 1 TABLET BY MOUTH TWICE A DAY AS NEEDED FOR SEVERE ANXIETY, Disp: 60 tablet, Rfl: 1 .  aspirin EC 81 MG tablet, Take 1 tablet (81 mg total) by mouth daily., Disp: 90 tablet, Rfl: 11 .  atorvastatin (LIPITOR) 20 MG tablet, Take 1 tablet (20 mg total) by mouth daily., Disp: 90 tablet, Rfl: 3 .  levothyroxine (SYNTHROID, LEVOTHROID) 75 MCG tablet, Take 1 tablet (75 mcg total) by mouth daily., Disp: 90 tablet, Rfl: 3 .  omeprazole (PRILOSEC) 20 MG capsule, Take 1 capsule (20 mg total) by mouth daily., Disp: 90 capsule, Rfl: 2 .  risedronate (ACTONEL) 35 MG tablet, Take 1 tablet (35 mg total) by mouth every 7 (seven) days. with water on empty stomach, nothing by mouth or lie down for next 30 minutes., Disp: 12 tablet, Rfl: 3 .  Vitamin D, Ergocalciferol, (DRISDOL) 1.25 MG (50000 UT) CAPS capsule, Take 1 capsule (50,000 Units total) by mouth every 7 (seven) days., Disp: 12 capsule, Rfl: 0  Review of Systems:  Constitutional: Denies fever, chills, diaphoresis, appetite change and fatigue.  HEENT: Denies photophobia, eye pain, redness, hearing loss, ear pain, congestion, sore throat, rhinorrhea, sneezing, mouth sores, trouble swallowing, neck pain, neck stiffness  and tinnitus.   Respiratory: Denies SOB, DOE, cough, chest tightness,  and wheezing.   Cardiovascular: Denies chest pain, palpitations and leg swelling.  Gastrointestinal: Denies nausea, vomiting, abdominal pain, diarrhea, constipation, blood in stool and abdominal distention.  Genitourinary: Denies dysuria, urgency, frequency, hematuria, flank pain and difficulty urinating.  Endocrine: Denies: hot or cold intolerance, sweats, changes in hair or nails, polyuria, polydipsia. Musculoskeletal: Denies myalgias, back pain, joint swelling, arthralgias and gait problem.  Skin: Denies  pallor, rash and wound.  Neurological: Denies dizziness, seizures, syncope, weakness, light-headedness, numbness and headaches.  Hematological: Denies adenopathy. Easy bruising, personal or family bleeding history  Psychiatric/Behavioral: Denies suicidal ideation, mood changes, confusion, nervousness, sleep disturbance and agitation    Physical Exam: Vitals:   08/29/18 0759  BP: 110/64  Pulse: 92  Temp: 98.4 F (36.9 C)  TempSrc: Oral  SpO2: 95%  Weight: 150 lb 4.8 oz (68.2 kg)  Height: '5\' 4"'$  (1.626 m)    Body mass index is 25.8 kg/m.   Constitutional: NAD, calm, comfortable Eyes: PERRL, lids and conjunctivae normal ENMT: Mucous membranes are moist. Posterior pharynx clear of any exudate or lesions. Normal dentition. Tympanic membrane is pearly white, no erythema or bulging. Neck: normal, supple, no masses, no thyromegaly Respiratory: clear to auscultation bilaterally, no wheezing, no crackles. Normal respiratory effort. No accessory muscle use.  Cardiovascular: Regular rate and rhythm, no murmurs / rubs / gallops. No extremity edema. 2+ pedal pulses. No carotid bruits.  Abdomen: no tenderness, no masses palpated. No hepatosplenomegaly. Bowel sounds positive.  Musculoskeletal: no clubbing / cyanosis. No joint deformity upper and lower extremities. Good ROM, no contractures. Normal muscle tone.  Skin: no rashes, lesions, ulcers are apparent. Neurologic: CN 2-12 grossly intact. Sensation intact, DTR normal. Strength 5/5 in all 4.  Psychiatric: Normal judgment and insight. Alert and oriented x 3. Normal mood.    Impression and Plan:  Acquired hypothyroidism  -For TSH today, continue current dose of Synthroid.  Dyslipidemia -Last LDL was 191 in October 2018, she resumed taking Lipitor at last visit in December. -Recheck lipids today.  Impaired glucose tolerance - Plan: Hemoglobin A1c -Discussed lifestyle modifications  Tobacco abuse -I have discussed tobacco cessation  with the patient.  I have counseled the patient regarding the negative impacts of continued tobacco use including but not limited to lung cancer, COPD, and cardiovascular disease.  I have discussed alternatives to tobacco and modalities that may help facilitate tobacco cessation including but not limited to biofeedback, hypnosis, and medications.  Total time spent with tobacco counseling was 4 minutes. -She is not ready to quit smoking at present.  Stenosis of carotid artery, unspecified laterality -Most recent carotid ultrasound in November 2018 with no stenosis of the right ICA and 1 to 39% stenosis of the left ICA. -She currently does not have a carotid bruit, consider repeat ultrasound at the two-year mark in November 2020.  Osteoporosis without current pathological fracture, unspecified osteoporosis type -She has completed vitamin D, was not able to afford risedronate due to cost, will give some information on Prol[employer] to see if she will qualify for patient assistance.  Vitamin D deficiency  -Recheck vitamin D levels today as she just completed 12 weeks of treatment.  Anxiety  -Will refill Xanax today, she takes 0.5 mg twice daily, will continue to discuss weaning at next visit.    Patient Instructions  -Nice seeing you today!  -Lab work today; will notify you with results.  -Tetanus at the pharmacy.  -Continue to  consider flu vaccine.  -Make sure you have routine eye care.  -Will send you to GYN for cervical cancer screening.  -Referral to GI for colonoscopy has been initiated.  -Schedule follow up in 3 months.   Preventive Care 58 Years and Older, Female Preventive care refers to lifestyle choices and visits with your health care provider that can promote health and wellness. What does preventive care include?  A yearly physical exam. This is also called an annual well check.  Dental exams once or twice a year.  Routine eye exams. Ask your health care provider how  often you should have your eyes checked.  Personal lifestyle choices, including: ? Daily care of your teeth and gums. ? Regular physical activity. ? Eating a healthy diet. ? Avoiding tobacco and drug use. ? Limiting alcohol use. ? Practicing safe sex. ? Taking low-dose aspirin every day. ? Taking vitamin and mineral supplements as recommended by your health care provider. What happens during an annual well check? The services and screenings done by your health care provider during your annual well check will depend on your age, overall health, lifestyle risk factors, and family history of disease. Counseling Your health care provider may ask you questions about your:  Alcohol use.  Tobacco use.  Drug use.  Emotional well-being.  Home and relationship well-being.  Sexual activity.  Eating habits.  History of falls.  Memory and ability to understand (cognition).  Work and work Statistician.  Reproductive health.  Screening You may have the following tests or measurements:  Height, weight, and BMI.  Blood pressure.  Lipid and cholesterol levels. These may be checked every 5 years, or more frequently if you are over 19 years old.  Skin check.  Lung cancer screening. You may have this screening every year starting at age 58 if you have a 30-pack-year history of smoking and currently smoke or have quit within the past 15 years.  Colorectal cancer screening. All adults should have this screening starting at age 35 and continuing until age 9. You will have tests every 1-10 years, depending on your results and the type of screening test. People at increased risk should start screening at an earlier age. Screening tests may include: ? Guaiac-based fecal occult blood testing. ? Fecal immunochemical test (FIT). ? Stool DNA test. ? Virtual colonoscopy. ? Sigmoidoscopy. During this test, a flexible tube with a tiny camera (sigmoidoscope) is used to examine your rectum and  lower colon. The sigmoidoscope is inserted through your anus into your rectum and lower colon. ? Colonoscopy. During this test, a long, thin, flexible tube with a tiny camera (colonoscope) is used to examine your entire colon and rectum.  Hepatitis C blood test.  Hepatitis B blood test.  Sexually transmitted disease (STD) testing.  Diabetes screening. This is done by checking your blood sugar (glucose) after you have not eaten for a while (fasting). You may have this done every 1-3 years.  Bone density scan. This is done to screen for osteoporosis. You may have this done starting at age 57.  Mammogram. This may be done every 1-2 years. Talk to your health care provider about how often you should have regular mammograms. Talk with your health care provider about your test results, treatment options, and if necessary, the need for more tests. Vaccines Your health care provider may recommend certain vaccines, such as:  Influenza vaccine. This is recommended every year.  Tetanus, diphtheria, and acellular pertussis (Tdap, Td) vaccine. You may need a  Td booster every 10 years.  Varicella vaccine. You may need this if you have not been vaccinated.  Zoster vaccine. You may need this after age 60.  Measles, mumps, and rubella (MMR) vaccine. You may need at least one dose of MMR if you were born in 1957 or later. You may also need a second dose.  Pneumococcal 13-valent conjugate (PCV13) vaccine. One dose is recommended after age 35.  Pneumococcal polysaccharide (PPSV23) vaccine. One dose is recommended after age 68.  Meningococcal vaccine. You may need this if you have certain conditions.  Hepatitis A vaccine. You may need this if you have certain conditions or if you travel or work in places where you may be exposed to hepatitis A.  Hepatitis B vaccine. You may need this if you have certain conditions or if you travel or work in places where you may be exposed to hepatitis  B.  Haemophilus influenzae type b (Hib) vaccine. You may need this if you have certain conditions. Talk to your health care provider about which screenings and vaccines you need and how often you need them. This information is not intended to replace advice given to you by your health care provider. Make sure you discuss any questions you have with your health care provider. Document Released: 07/09/2015 Document Revised: 08/02/2017 Document Reviewed: 04/13/2015 Elsevier Interactive Patient Education  2019 [place], [place] [facility]

## 2018-08-30 ENCOUNTER — Ambulatory Visit: Payer: PPO | Admitting: Internal Medicine

## 2018-09-03 ENCOUNTER — Encounter: Payer: Self-pay | Admitting: Internal Medicine

## 2018-09-03 ENCOUNTER — Other Ambulatory Visit: Payer: Self-pay | Admitting: Internal Medicine

## 2018-11-04 ENCOUNTER — Other Ambulatory Visit: Payer: Self-pay | Admitting: Internal Medicine

## 2018-11-04 DIAGNOSIS — F419 Anxiety disorder, unspecified: Secondary | ICD-10-CM

## 2018-12-05 ENCOUNTER — Other Ambulatory Visit: Payer: Self-pay

## 2018-12-05 ENCOUNTER — Other Ambulatory Visit (INDEPENDENT_AMBULATORY_CARE_PROVIDER_SITE_OTHER): Payer: PPO

## 2018-12-05 ENCOUNTER — Other Ambulatory Visit: Payer: Self-pay | Admitting: Internal Medicine

## 2018-12-05 DIAGNOSIS — E559 Vitamin D deficiency, unspecified: Secondary | ICD-10-CM

## 2018-12-05 LAB — COMPREHENSIVE METABOLIC PANEL
ALT: 20 U/L (ref 0–35)
AST: 18 U/L (ref 0–37)
Albumin: 4.2 g/dL (ref 3.5–5.2)
Alkaline Phosphatase: 83 U/L (ref 39–117)
BUN: 11 mg/dL (ref 6–23)
CO2: 29 mEq/L (ref 19–32)
Calcium: 10.2 mg/dL (ref 8.4–10.5)
Chloride: 105 mEq/L (ref 96–112)
Creatinine, Ser: 0.81 mg/dL (ref 0.40–1.20)
GFR: 70.58 mL/min (ref >60.00)
Glucose, Bld: 88 mg/dL (ref 70–99)
Potassium: 4.6 mEq/L (ref 3.5–5.1)
Sodium: 140 mEq/L (ref 135–145)
Total Bilirubin: 0.5 mg/dL (ref 0.2–1.2)
Total Protein: 6.4 g/dL (ref 6.0–8.3)

## 2018-12-05 LAB — VITAMIN D 25 HYDROXY (VIT D DEFICIENCY, FRACTURES): VITD: 26.35 ng/mL — ABNORMAL LOW (ref 30.00–100.00)

## 2018-12-06 ENCOUNTER — Other Ambulatory Visit: Payer: Self-pay | Admitting: Internal Medicine

## 2018-12-06 ENCOUNTER — Encounter: Payer: Self-pay | Admitting: Internal Medicine

## 2018-12-06 DIAGNOSIS — E559 Vitamin D deficiency, unspecified: Secondary | ICD-10-CM

## 2018-12-19 ENCOUNTER — Ambulatory Visit: Payer: PPO | Admitting: *Deleted

## 2018-12-19 ENCOUNTER — Other Ambulatory Visit: Payer: Self-pay

## 2018-12-19 VITALS — Ht 64.0 in | Wt 150.0 lb

## 2018-12-19 DIAGNOSIS — R195 Other fecal abnormalities: Secondary | ICD-10-CM

## 2018-12-19 MED ORDER — SUPREP BOWEL PREP KIT 17.5-3.13-1.6 GM/177ML PO SOLN: 1.0000 | Freq: Once | ORAL | 0 refills | Status: AC

## 2018-12-19 NOTE — Progress Notes (Signed)
No egg or soy allergy known to patient  No issues with past sedation with any surgeries  or procedures, no intubation problems  No diet pills per patient No home 02 use per patient  No blood thinners per patient  Pt denies issues with constipation  No A fib or A flutter  EMMI video sent to pt's e mail  Instructions sent in My Chart and Printed to be picked up by pt on 3rd floor   Pt verified name, DOB, address and insurance during PV today. Pt instruction packet to be picked up included paper to complete and mail back to [facility] with addressed and stamped envelope, Emmi video, copy of consent form to read and not return, and instructions. PV completed over the phone. Pt encouraged to call with questions or issues   Pt is aware that care partner will wait in the car during parking lot; if they feel like they will be too hot to wait in the car; they may wait in the lobby.  We want them to wear a mask (we do not have any that we can provide them), practice social distancing, and we will check their temperatures when they get here.  I did remind patient that their care partner needs to stay in the parking lot the entire time. Pt will wear mask into building  Suprep Lot [id number]  Exp 02/22- pt states she cannot pay [id number] for Suprep- sample given -pt to pick up 3rd floor desk with a a mask on

## 2019-01-01 ENCOUNTER — Other Ambulatory Visit: Payer: Self-pay | Admitting: Internal Medicine

## 2019-01-01 DIAGNOSIS — F419 Anxiety disorder, unspecified: Secondary | ICD-10-CM

## 2019-01-02 NOTE — Telephone Encounter (Signed)
Pt called stating she has been in contact with pharmacy and they have been trying to refill this medication. Pt states she does not have enough of this medication anymore. Please advise.

## 2019-01-22 ENCOUNTER — Encounter: Payer: PPO | Admitting: Internal Medicine

## 2019-02-26 ENCOUNTER — Other Ambulatory Visit: Payer: Self-pay | Admitting: Internal Medicine

## 2019-02-26 DIAGNOSIS — F419 Anxiety disorder, unspecified: Secondary | ICD-10-CM

## 2019-02-26 MED ORDER — LEVOTHYROXINE SODIUM 75 MCG PO TABS: 75.0000 ug | ORAL_TABLET | Freq: Every day | ORAL | 1 refills | Status: DC

## 2019-03-27 ENCOUNTER — Other Ambulatory Visit: Payer: Self-pay | Admitting: Internal Medicine

## 2019-03-27 ENCOUNTER — Ambulatory Visit: Payer: PPO | Admitting: Internal Medicine

## 2019-03-27 ENCOUNTER — Encounter: Payer: Self-pay | Admitting: Internal Medicine

## 2019-03-27 ENCOUNTER — Ambulatory Visit (INDEPENDENT_AMBULATORY_CARE_PROVIDER_SITE_OTHER): Payer: PPO | Admitting: Internal Medicine

## 2019-03-27 ENCOUNTER — Other Ambulatory Visit: Payer: Self-pay

## 2019-03-27 ENCOUNTER — Other Ambulatory Visit: Payer: Self-pay | Admitting: *Deleted

## 2019-03-27 VITALS — BP 110/80 | HR 101 | Temp 97.2°F | Ht 64.0 in | Wt 153.7 lb

## 2019-03-27 DIAGNOSIS — E039 Hypothyroidism, unspecified: Secondary | ICD-10-CM

## 2019-03-27 DIAGNOSIS — C44519 Basal cell carcinoma of skin of other part of trunk: Secondary | ICD-10-CM

## 2019-03-27 DIAGNOSIS — I6529 Occlusion and stenosis of unspecified carotid artery: Secondary | ICD-10-CM | POA: Diagnosis not present

## 2019-03-27 DIAGNOSIS — E785 Hyperlipidemia, unspecified: Secondary | ICD-10-CM

## 2019-03-27 DIAGNOSIS — Z0000 Encounter for general adult medical examination without abnormal findings: Secondary | ICD-10-CM | POA: Diagnosis not present

## 2019-03-27 DIAGNOSIS — E559 Vitamin D deficiency, unspecified: Secondary | ICD-10-CM | POA: Diagnosis not present

## 2019-03-27 DIAGNOSIS — Z720 Tobacco use: Secondary | ICD-10-CM

## 2019-03-27 DIAGNOSIS — Z1283 Encounter for screening for malignant neoplasm of skin: Secondary | ICD-10-CM | POA: Diagnosis not present

## 2019-03-27 DIAGNOSIS — R7302 Impaired glucose tolerance (oral): Secondary | ICD-10-CM

## 2019-03-27 DIAGNOSIS — M810 Age-related osteoporosis without current pathological fracture: Secondary | ICD-10-CM | POA: Diagnosis not present

## 2019-03-27 LAB — COMPREHENSIVE METABOLIC PANEL
ALT: 17 U/L (ref 0–35)
AST: 18 U/L (ref 0–37)
Albumin: 4.6 g/dL (ref 3.5–5.2)
Alkaline Phosphatase: 97 U/L (ref 39–117)
BUN: 9 mg/dL (ref 6–23)
CO2: 29 mEq/L (ref 19–32)
Calcium: 11.7 mg/dL — ABNORMAL HIGH (ref 8.4–10.5)
Chloride: 104 mEq/L (ref 96–112)
Creatinine, Ser: 0.84 mg/dL (ref 0.40–1.20)
GFR: 67.61 mL/min (ref >60.00)
Glucose, Bld: 96 mg/dL (ref 70–99)
Potassium: 5.1 mEq/L (ref 3.5–5.1)
Sodium: 141 mEq/L (ref 135–145)
Total Bilirubin: 0.6 mg/dL (ref 0.2–1.2)
Total Protein: 7.3 g/dL (ref 6.0–8.3)

## 2019-03-27 LAB — CBC WITH DIFFERENTIAL/PLATELET
Basophils Absolute: 0 10*3/uL (ref 0.0–0.1)
Basophils Relative: 0.6 % (ref 0.0–3.0)
Eosinophils Absolute: 0.2 10*3/uL (ref 0.0–0.7)
Eosinophils Relative: 3.4 % (ref 0.0–5.0)
HCT: 45.3 % (ref 36.0–46.0)
Hemoglobin: 15.4 g/dL — ABNORMAL HIGH (ref 12.0–15.0)
Lymphocytes Relative: 33.9 % (ref 12.0–46.0)
Lymphs Abs: 2.1 10*3/uL (ref 0.7–4.0)
MCHC: 34 g/dL (ref 30.0–36.0)
MCV: 95.3 fl (ref 78.0–100.0)
Monocytes Absolute: 0.5 10*3/uL (ref 0.1–1.0)
Monocytes Relative: 7.2 % (ref 3.0–12.0)
Neutro Abs: 3.4 10*3/uL (ref 1.4–7.7)
Neutrophils Relative %: 54.9 % (ref 43.0–77.0)
Platelets: 273 10*3/uL (ref 150.0–400.0)
RBC: 4.76 Mil/uL (ref 3.87–5.11)
RDW: 13.3 % (ref 11.5–15.5)
WBC: 6.3 10*3/uL (ref 4.0–10.5)

## 2019-03-27 LAB — VITAMIN B12: Vitamin B-12: 167 pg/mL — ABNORMAL LOW (ref 211–911)

## 2019-03-27 LAB — LIPID PANEL
Cholesterol: 219 mg/dL — ABNORMAL HIGH (ref 0–200)
HDL: 64.2 mg/dL (ref >39.00)
LDL Cholesterol: 125 mg/dL — ABNORMAL HIGH (ref 0–99)
NonHDL: 155.27
Total CHOL/HDL Ratio: 3
Triglycerides: 153 mg/dL — ABNORMAL HIGH (ref 0.0–149.0)
VLDL: 30.6 mg/dL (ref 0.0–40.0)

## 2019-03-27 LAB — TSH: TSH: 2.13 u[IU]/mL (ref 0.35–4.50)

## 2019-03-27 LAB — HEMOGLOBIN A1C: Hgb A1c MFr Bld: 6 % (ref 4.6–6.5)

## 2019-03-27 LAB — VITAMIN D 25 HYDROXY (VIT D DEFICIENCY, FRACTURES): VITD: 38.59 ng/mL (ref 30.00–100.00)

## 2019-03-27 NOTE — Progress Notes (Addendum)
   Established Patient Office Visit     CC/Reason for Visit: Annual preventive exam and subsequent Medicare wellness visit  HPI: Tonya Cherry is a 68 y.o. female who is coming in today for the above mentioned reasons. Past Medical History is significant for:Hyperlipidemia, carotid artery diseasethathas surprisingly regresseddespite lack of risk factor modification, continued tobacco abuse, hypothyroidism, anxiety disorder on twice daily alprazolam, vitamin D deficiency, osteoporosis with a T score of -2.7 in 2009.  She has routine dental care, needs routine eye care, she has some mild hearing loss in her left ear, she is very sedentary and does not exercise much.  She is due for flu, tetanus, shingles vaccine which she states she will get at her pharmacy (she has refused vaccinations in the past).  She continues to smoke about a pack a day, is not interested in quitting at this time.  She unfortunately had a positive Cologuard and was referred to GI for screening colonoscopy that was postponed due to the COVID-19 pandemic.  She tells me it has been rescheduled for December.  She shows me a lesion on her chest that she would like me to look at.   Past Medical/Surgical History: Past Medical History:  Diagnosis Date  . Anxiety   . COPD (chronic obstructive pulmonary disease) (HCC)   . Depression   . GERD (gastroesophageal reflux disease)   . Hyperlipidemia   . Osteoporosis   . Thyroid disease   . Ulcer     Past Surgical History:  Procedure Laterality Date  . CESAREAN SECTION      Social History:  reports that she has been smoking cigarettes. She has been smoking about 1.00 pack per day. She has never used smokeless tobacco. She reports that she does not drink alcohol or use drugs.  Allergies: Allergies  Allergen Reactions  . Penicillins Cross Reactors Rash  . Sulfa Antibiotics Rash  . Other Diarrhea    "mycin"     Family History:  Family History  Problem  Relation Age of Onset  . Heart disease Mother   . Stroke Mother   . Alcohol abuse Father   . Cancer Maternal Grandmother        colon  . Colon cancer Maternal Grandmother   . Colon polyps Brother   . Breast cancer Neg Hx   . Esophageal cancer Neg Hx   . Rectal cancer Neg Hx   . Stomach cancer Neg Hx      Current Outpatient Medications:  .  ALPRAZolam (XANAX) 0.5 MG tablet, TAKE 1 TABLET (0.5 MG TOTAL) BY MOUTH 2 (TWO) TIMES DAILY AS NEEDED, Disp: 60 tablet, Rfl: 1 .  aspirin EC 81 MG tablet, Take 1 tablet (81 mg total) by mouth daily., Disp: 90 tablet, Rfl: 11 .  atorvastatin (LIPITOR) 20 MG tablet, Take 1 tablet (20 mg total) by mouth daily., Disp: 90 tablet, Rfl: 3 .  cholecalciferol (VITAMIN D3) 25 MCG (1000 UT) tablet, Take 5,000 Units by mouth daily. , Disp: , Rfl:  .  levothyroxine (SYNTHROID) 75 MCG tablet, Take 1 tablet (75 mcg total) by mouth daily., Disp: 90 tablet, Rfl: 1  Review of Systems:  Constitutional: Denies fever, chills, diaphoresis, appetite change and fatigue.  HEENT: Denies photophobia, eye pain, redness, hearing loss, ear pain, congestion, sore throat, rhinorrhea, sneezing, mouth sores, trouble swallowing, neck pain, neck stiffness and tinnitus.   Respiratory: Denies SOB, DOE, cough, chest tightness,  and wheezing.   Cardiovascular: Denies chest pain, palpitations and leg   swelling.  Gastrointestinal: Denies nausea, vomiting, abdominal pain, diarrhea, constipation, blood in stool and abdominal distention.  Genitourinary: Denies dysuria, urgency, frequency, hematuria, flank pain and difficulty urinating.  Endocrine: Denies: hot or cold intolerance, sweats, changes in hair or nails, polyuria, polydipsia. Musculoskeletal: Denies myalgias, back pain, joint swelling, arthralgias and gait problem.  Skin: Denies pallor, rash and wound.  Neurological: Denies dizziness, seizures, syncope, weakness, light-headedness, numbness and headaches.  Hematological: Denies  adenopathy. Easy bruising, personal or family bleeding history  Psychiatric/Behavioral: Denies suicidal ideation, mood changes, confusion, nervousness, sleep disturbance and agitation    Physical Exam: Vitals:   03/27/19 0810  BP: 110/80  Pulse: (!) 101  Temp: (!) 97.2 F (36.2 C)  TempSrc: Temporal  SpO2: 97%  Weight: 153 lb 11.2 oz (69.7 kg)  Height: 5' 4" (1.626 m)    Body mass index is 26.38 kg/m.   Constitutional: NAD, calm, comfortable Eyes: PERRL, lids and conjunctivae normal ENMT: Mucous membranes are moist.  Tympanic membrane is pearly white, no erythema or bulging. Neck: normal, supple, no masses, no thyromegaly Respiratory: clear to auscultation bilaterally, no wheezing, no crackles. Normal respiratory effort. No accessory muscle use.  Cardiovascular: Regular rate and rhythm, no murmurs / rubs / gallops. No extremity edema. 2+ pedal pulses. No carotid bruits.  Abdomen: no tenderness, no masses palpated. No hepatosplenomegaly. Bowel sounds positive.  Musculoskeletal: no clubbing / cyanosis. No joint deformity upper and lower extremities. Good ROM, no contractures. Normal muscle tone.  Skin: pearlescent nodule in the center of her anterior chest, in the top center area of her breasts. Neurologic: CN 2-12 grossly intact. Sensation intact, DTR normal. Strength 5/5 in all 4.  Psychiatric: Normal judgment and insight. Alert and oriented x 3. Normal mood.   Subsequent Medicare wellness visit   1. Risk factors, based on past  M,S,F -cardiovascular disease risk factors age, history of hyperlipidemia.   2.  Physical activities: She is very sedentary   3.  Depression/mood:  Mood is stable, not depressed   4.  Hearing:  She has mild hearing loss of the left ear   5.  ADL's: Independent in all ADLs   6.  Fall risk:  Low fall risk   7.  Home safety: No problems identified   8.  Height weight, and visual acuity: Height and weight as above, visual acuity is 20/50 with  each eye independently and 20/32 with eyes together   9.  Counseling:  Have again counseled on smoking cessation, have advised flu vaccine   10. Lab orders based on risk factors: Laboratory update will be reviewed   11. Referral :  Dermatology referral   12. Care plan:  Follow-up with me in 4 months   13. Cognitive assessment:  No cognitive impairment   14. Screening: Patient provided with a written and personalized 5-10 year screening schedule in the AVS.   yes   15. Provider List Update:   PCP only  16. Advance Directives: Full code     Office Visit from 03/27/2019 in [facility] [place]  PHQ-9 Total Score  4      Fall Risk  03/27/2019 08/29/2018 05/30/2018  Falls in the past year? 0 0 0  Number falls in past yr: 0 0 0  Injury with Fall? 0 0 0     Impression and Plan:  Encounter for preventive health examination  -She has routine dental care, have advised routine eye care. -She is due for flu, tetanus, shingles vaccinations which she  will receive at her pharmacy. -Screening labs today. -Healthy lifestyle has been discussed in detail. -She has been referred to GI for a colonoscopy given a positive Cologuard, this was deferred due to COVID-19 and has been rescheduled for December. -She had a mammogram in February 2020. -Has Pap smears with her GYN. -DEXA scan requested today.  Hypercalcemia  - Plan: Comprehensive metabolic panel -She states she is currently not taking calcium supplementation, further plan pending results.  Vitamin D deficiency  -Check vitamin D today.  Osteoporosis without current pathological fracture, unspecified osteoporosis type -She has never taken a bisphosphonate due to cost issues. -DEXA ordered today.  Dyslipidemia  -Last LDL was 99 in March 2020. -Continue atorvastatin, check lipids.  Stenosis of carotid artery, unspecified laterality  -Most recent carotid ultrasound in November 2018 with no stenosis of the right ICA and 1  to 39% stenosis of the left ICA. -She currently does not have a carotid bruit, consider repeat ultrasound at the two-year mark in November 2020.  Impaired glucose tolerance  - Plan: Hemoglobin A1c  Acquired hypothyroidism  - Plan: TSH -Continue current Synthroid dose.  Tobacco abuse -She continues to smoke a pack a day, she is not interested in any smoking cessation at this time.  Suspected BCC of anterior chest -Refer to dermatology.    Patient Instructions  -Nice seeing you today!!  -Lab work today; will notify you once results are available.  -Dermatology referral placed.  -Please schedule appointment with an eye doctor.  -DEXA scan ordered today.  -Remember flu, tetanus and shingles vaccinations at your pharmacy.  -Schedule follow up in 6 months.   Preventive Care 65 Years and Older, Female Preventive care refers to lifestyle choices and visits with your health care provider that can promote health and wellness. This includes:  A yearly physical exam. This is also called an annual well check.  Regular dental and eye exams.  Immunizations.  Screening for certain conditions.  Healthy lifestyle choices, such as diet and exercise. What can I expect for my preventive care visit? Physical exam Your health care provider will check:  Height and weight. These may be used to calculate body mass index (BMI), which is a measurement that tells if you are at a healthy weight.  Heart rate and blood pressure.  Your skin for abnormal spots. Counseling Your health care provider may ask you questions about:  Alcohol, tobacco, and drug use.  Emotional well-being.  Home and relationship well-being.  Sexual activity.  Eating habits.  History of falls.  Memory and ability to understand (cognition).  Work and work environment.  Pregnancy and menstrual history. What immunizations do I need?  Influenza (flu) vaccine  This is recommended every year. Tetanus,  diphtheria, and pertussis (Tdap) vaccine  You may need a Td booster every 10 years. Varicella (chickenpox) vaccine  You may need this vaccine if you have not already been vaccinated. Zoster (shingles) vaccine  You may need this after age 60. Pneumococcal conjugate (PCV13) vaccine  One dose is recommended after age 65. Pneumococcal polysaccharide (PPSV23) vaccine  One dose is recommended after age 65. Measles, mumps, and rubella (MMR) vaccine  You may need at least one dose of MMR if you were born in 1957 or later. You may also need a second dose. Meningococcal conjugate (MenACWY) vaccine  You may need this if you have certain conditions. Hepatitis A vaccine  You may need this if you have certain conditions or if you travel or work in places   where you may be exposed to hepatitis A. Hepatitis B vaccine  You may need this if you have certain conditions or if you travel or work in places where you may be exposed to hepatitis B. Haemophilus influenzae type b (Hib) vaccine  You may need this if you have certain conditions. You may receive vaccines as individual doses or as more than one vaccine together in one shot (combination vaccines). Talk with your health care provider about the risks and benefits of combination vaccines. What tests do I need? Blood tests  Lipid and cholesterol levels. These may be checked every 5 years, or more frequently depending on your overall health.  Hepatitis C test.  Hepatitis B test. Screening  Lung cancer screening. You may have this screening every year starting at age 55 if you have a 30-pack-year history of smoking and currently smoke or have quit within the past 15 years.  Colorectal cancer screening. All adults should have this screening starting at age 50 and continuing until age 75. Your health care provider may recommend screening at age 45 if you are at increased risk. You will have tests every 1-10 years, depending on your results and  the type of screening test.  Diabetes screening. This is done by checking your blood sugar (glucose) after you have not eaten for a while (fasting). You may have this done every 1-3 years.  Mammogram. This may be done every 1-2 years. Talk with your health care provider about how often you should have regular mammograms.  BRCA-related cancer screening. This may be done if you have a family history of breast, ovarian, tubal, or peritoneal cancers. Other tests  Sexually transmitted disease (STD) testing.  Bone density scan. This is done to screen for osteoporosis. You may have this done starting at age 65. Follow these instructions at home: Eating and drinking  Eat a diet that includes fresh fruits and vegetables, whole grains, lean protein, and low-fat dairy products. Limit your intake of foods with high amounts of sugar, saturated fats, and salt.  Take vitamin and mineral supplements as recommended by your health care provider.  Do not drink alcohol if your health care provider tells you not to drink.  If you drink alcohol: ? Limit how much you have to 0-1 drink a day. ? Be aware of how much alcohol is in your drink. In the [place] one drink equals one 12 oz bottle of beer (355 mL), one 5 oz glass of wine (148 mL), or one 1 oz glass of hard liquor (44 mL). Lifestyle  Take daily care of your teeth and gums.  Stay active. Exercise for at least 30 minutes on 5 or more days each week.  Do not use any products that contain nicotine or tobacco, such as cigarettes, e-cigarettes, and chewing tobacco. If you need help quitting, ask your health care provider.  If you are sexually active, practice safe sex. Use a condom or other form of protection in order to prevent STIs (sexually transmitted infections).  Talk with your health care provider about taking a low-dose aspirin or statin. What's next?  Go to your health care provider once a year for a well check visit.  Ask your health care  provider how often you should have your eyes and teeth checked.  Stay up to date on all vaccines. This information is not intended to replace advice given to you by your health care provider. Make sure you discuss any questions you have with your health care   provider. Document Released: 07/09/2015 Document Revised: 06/06/2018 Document Reviewed: 06/06/2018 Elsevier Patient Education  2020 [place], MD [facility]

## 2019-03-27 NOTE — Patient Instructions (Signed)
-Nice seeing you today!!  -Lab work today; will notify you once results are available.  -Dermatology referral placed.  -Please schedule appointment with an eye doctor.  -DEXA scan ordered today.  -Remember flu, tetanus and shingles vaccinations at your pharmacy.  -Schedule follow up in 6 months.   Preventive Care 10 Years and Older, Female Preventive care refers to lifestyle choices and visits with your health care provider that can promote health and wellness. This includes:  A yearly physical exam. This is also called an annual well check.  Regular dental and eye exams.  Immunizations.  Screening for certain conditions.  Healthy lifestyle choices, such as diet and exercise. What can I expect for my preventive care visit? Physical exam Your health care provider will check:  Height and weight. These may be used to calculate body mass index (BMI), which is a measurement that tells if you are at a healthy weight.  Heart rate and blood pressure.  Your skin for abnormal spots. Counseling Your health care provider may ask you questions about:  Alcohol, tobacco, and drug use.  Emotional well-being.  Home and relationship well-being.  Sexual activity.  Eating habits.  History of falls.  Memory and ability to understand (cognition).  Work and work Statistician.  Pregnancy and menstrual history. What immunizations do I need?  Influenza (flu) vaccine  This is recommended every year. Tetanus, diphtheria, and pertussis (Tdap) vaccine  You may need a Td booster every 10 years. Varicella (chickenpox) vaccine  You may need this vaccine if you have not already been vaccinated. Zoster (shingles) vaccine  You may need this after age 1. Pneumococcal conjugate (PCV13) vaccine  One dose is recommended after age 46. Pneumococcal polysaccharide (PPSV23) vaccine  One dose is recommended after age 21. Measles, mumps, and rubella (MMR) vaccine  You may need at  least one dose of MMR if you were born in 1957 or later. You may also need a second dose. Meningococcal conjugate (MenACWY) vaccine  You may need this if you have certain conditions. Hepatitis A vaccine  You may need this if you have certain conditions or if you travel or work in places where you may be exposed to hepatitis A. Hepatitis B vaccine  You may need this if you have certain conditions or if you travel or work in places where you may be exposed to hepatitis B. Haemophilus influenzae type b (Hib) vaccine  You may need this if you have certain conditions. You may receive vaccines as individual doses or as more than one vaccine together in one shot (combination vaccines). Talk with your health care provider about the risks and benefits of combination vaccines. What tests do I need? Blood tests  Lipid and cholesterol levels. These may be checked every 5 years, or more frequently depending on your overall health.  Hepatitis C test.  Hepatitis B test. Screening  Lung cancer screening. You may have this screening every year starting at age 66 if you have a 30-pack-year history of smoking and currently smoke or have quit within the past 15 years.  Colorectal cancer screening. All adults should have this screening starting at age 13 and continuing until age 52. Your health care provider may recommend screening at age 3 if you are at increased risk. You will have tests every 1-10 years, depending on your results and the type of screening test.  Diabetes screening. This is done by checking your blood sugar (glucose) after you have not eaten for a while (fasting). You  may have this done every 1-3 years.  Mammogram. This may be done every 1-2 years. Talk with your health care provider about how often you should have regular mammograms.  BRCA-related cancer screening. This may be done if you have a family history of breast, ovarian, tubal, or peritoneal cancers. Other tests  Sexually  transmitted disease (STD) testing.  Bone density scan. This is done to screen for osteoporosis. You may have this done starting at age 73. Follow these instructions at home: Eating and drinking  Eat a diet that includes fresh fruits and vegetables, whole grains, lean protein, and low-fat dairy products. Limit your intake of foods with high amounts of sugar, saturated fats, and salt.  Take vitamin and mineral supplements as recommended by your health care provider.  Do not drink alcohol if your health care provider tells you not to drink.  If you drink alcohol: ? Limit how much you have to 0-1 drink a day. ? Be aware of how much alcohol is in your drink. In the [place] one drink equals one 12 oz bottle of beer (355 mL), one 5 oz glass of wine (148 mL), or one 1 oz glass of hard liquor (44 mL). Lifestyle  Take daily care of your teeth and gums.  Stay active. Exercise for at least 30 minutes on 5 or more days each week.  Do not use any products that contain nicotine or tobacco, such as cigarettes, e-cigarettes, and chewing tobacco. If you need help quitting, ask your health care provider.  If you are sexually active, practice safe sex. Use a condom or other form of protection in order to prevent STIs (sexually transmitted infections).  Talk with your health care provider about taking a low-dose aspirin or statin. What's next?  Go to your health care provider once a year for a well check visit.  Ask your health care provider how often you should have your eyes and teeth checked.  Stay up to date on all vaccines. This information is not intended to replace advice given to you by your health care provider. Make sure you discuss any questions you have with your health care provider. Document Released: 07/09/2015 Document Revised: 06/06/2018 Document Reviewed: 06/06/2018 Elsevier Patient Education  2020 [employer].

## 2019-03-28 ENCOUNTER — Other Ambulatory Visit: Payer: Self-pay

## 2019-03-28 ENCOUNTER — Other Ambulatory Visit (INDEPENDENT_AMBULATORY_CARE_PROVIDER_SITE_OTHER): Payer: PPO

## 2019-03-31 LAB — PTH, INTACT AND CALCIUM
Calcium: 10.8 mg/dL — ABNORMAL HIGH (ref 8.6–10.4)
PTH: 52 pg/mL (ref 14–64)

## 2019-04-02 ENCOUNTER — Other Ambulatory Visit: Payer: Self-pay | Admitting: Internal Medicine

## 2019-04-06 ENCOUNTER — Encounter: Payer: Self-pay | Admitting: Internal Medicine

## 2019-04-07 ENCOUNTER — Other Ambulatory Visit: Payer: Self-pay

## 2019-04-07 ENCOUNTER — Ambulatory Visit (INDEPENDENT_AMBULATORY_CARE_PROVIDER_SITE_OTHER): Payer: PPO | Admitting: *Deleted

## 2019-04-07 DIAGNOSIS — E538 Deficiency of other specified B group vitamins: Secondary | ICD-10-CM

## 2019-04-07 MED ORDER — CYANOCOBALAMIN 1000 MCG/ML IJ SOLN
1000.0000 ug | Freq: Once | INTRAMUSCULAR | Status: AC
  Administered 2019-04-07: 1000 ug via INTRAMUSCULAR

## 2019-04-07 NOTE — Progress Notes (Signed)
Patient in office for b-12 injection. Injection administered with no reactions

## 2019-04-14 ENCOUNTER — Ambulatory Visit (INDEPENDENT_AMBULATORY_CARE_PROVIDER_SITE_OTHER): Payer: PPO

## 2019-04-14 ENCOUNTER — Other Ambulatory Visit: Payer: Self-pay

## 2019-04-14 DIAGNOSIS — E538 Deficiency of other specified B group vitamins: Secondary | ICD-10-CM

## 2019-04-14 MED ORDER — CYANOCOBALAMIN 1000 MCG/ML IJ SOLN
1000.0000 ug | Freq: Once | INTRAMUSCULAR | Status: AC
  Administered 2019-04-14: 13:00:00 1000 ug via INTRAMUSCULAR

## 2019-04-14 NOTE — Progress Notes (Signed)
Per orders of Dr.Hernandez , injection of B12 given in left deltoid by Kendra R Johnson. Patient tolerated injection well.  

## 2019-04-21 ENCOUNTER — Other Ambulatory Visit: Payer: Self-pay

## 2019-04-21 ENCOUNTER — Ambulatory Visit (INDEPENDENT_AMBULATORY_CARE_PROVIDER_SITE_OTHER): Payer: PPO | Admitting: *Deleted

## 2019-04-21 DIAGNOSIS — E538 Deficiency of other specified B group vitamins: Secondary | ICD-10-CM

## 2019-04-21 MED ORDER — CYANOCOBALAMIN 1000 MCG/ML IJ SOLN
1000.0000 ug | Freq: Once | INTRAMUSCULAR | Status: AC
  Administered 2019-04-21: 1000 ug via INTRAMUSCULAR

## 2019-04-21 NOTE — Progress Notes (Signed)
Per orders of Dr. Fry, injection of B12 given by Chiante Peden. Patient tolerated injection well. 

## 2019-04-28 ENCOUNTER — Ambulatory Visit (INDEPENDENT_AMBULATORY_CARE_PROVIDER_SITE_OTHER): Payer: PPO | Admitting: Internal Medicine

## 2019-04-28 ENCOUNTER — Other Ambulatory Visit: Payer: Self-pay

## 2019-04-28 ENCOUNTER — Other Ambulatory Visit: Payer: Self-pay | Admitting: Internal Medicine

## 2019-04-28 DIAGNOSIS — E538 Deficiency of other specified B group vitamins: Secondary | ICD-10-CM

## 2019-04-28 DIAGNOSIS — F419 Anxiety disorder, unspecified: Secondary | ICD-10-CM

## 2019-04-28 MED ORDER — CYANOCOBALAMIN 1000 MCG/ML IJ SOLN
1000.0000 ug | Freq: Once | INTRAMUSCULAR | Status: AC
  Administered 2019-04-28: 11:00:00 1000 ug via INTRAMUSCULAR

## 2019-04-28 NOTE — Progress Notes (Signed)
Pt came for 4th b12 injection. Pt tolerated her injection well.

## 2019-04-29 ENCOUNTER — Other Ambulatory Visit: Payer: Self-pay

## 2019-05-01 ENCOUNTER — Ambulatory Visit (INDEPENDENT_AMBULATORY_CARE_PROVIDER_SITE_OTHER): Payer: PPO | Admitting: Endocrinology

## 2019-05-01 ENCOUNTER — Encounter: Payer: Self-pay | Admitting: Endocrinology

## 2019-05-01 NOTE — Patient Instructions (Signed)
Blood tests are requested for you today.  We'll let you know about the results.  If these are normal, the next step is to check a 24-HR urine for calcium, to see if the kidneys are properly eliminating calcium.

## 2019-05-01 NOTE — Progress Notes (Signed)
Subjective:    Patient ID: Tonya Cherry, female    DOB: 04/06/1951, 68 y.o.   MRN: [id number]  HPI Pt is referred by Dr Doreatha Lew, for hypercalcemia.  Pt was noted to have hypercalcemia in 2016.  she has never had osteoporosis, urolithiasis, parathyroid probs, sarcoidosis, cancer, PUD, pancreatitis, depression, or bony fracture.  she does not take vitamin-A supplement.  Pt denies taking antacids, Li++, or HCTZ.  She reports slight pain at the hips, but no assoc falls.   Past Medical History:  Diagnosis Date  . Anxiety   . COPD (chronic obstructive pulmonary disease) ([place])   . Depression   . GERD (gastroesophageal reflux disease)   . Hyperlipidemia   . Osteoporosis   . Thyroid disease   . Ulcer     Past Surgical History:  Procedure Laterality Date  . CESAREAN SECTION      Social History   Socioeconomic History  . Marital status: Widowed    Spouse name: Not on file  . Number of children: Not on file  . Years of education: Not on file  . Highest education level: Not on file  Occupational History  . Not on file  Social Needs  . Financial resource strain: Not on file  . Food insecurity    Worry: Not on file    Inability: Not on file  . Transportation needs    Medical: Not on file    Non-medical: Not on file  Tobacco Use  . Smoking status: Current Every Day Smoker    Packs/day: 1.00    Types: Cigarettes  . Smokeless tobacco: Never Used  Substance and Sexual Activity  . Alcohol use: No  . Drug use: No  . Sexual activity: Not on file  Lifestyle  . Physical activity    Days per week: Not on file    Minutes per session: Not on file  . Stress: Not on file  Relationships  . Social Herbalist on phone: Not on file    Gets together: Not on file    Attends religious service: Not on file    Active member of club or organization: Not on file    Attends meetings of clubs or organizations: Not on file    Relationship status: Not on file  . Intimate partner  violence    Fear of current or ex partner: Not on file    Emotionally abused: Not on file    Physically abused: Not on file    Forced sexual activity: Not on file  Other Topics Concern  . Not on file  Social History Narrative  . Not on file    Current Outpatient Medications on File Prior to Visit  Medication Sig Dispense Refill  . ALPRAZolam (XANAX) 0.5 MG tablet TAKE 1 TABLET (0.5 MG TOTAL) BY MOUTH 2 (TWO) TIMES DAILY AS NEEDED 60 tablet 1  . atorvastatin (LIPITOR) 20 MG tablet Take 1 tablet (20 mg total) by mouth daily. 90 tablet 3  . cholecalciferol (VITAMIN D3) 25 MCG (1000 UT) tablet Take 5,000 Units by mouth every other day.     . levothyroxine (SYNTHROID) 75 MCG tablet Take 1 tablet (75 mcg total) by mouth daily. 90 tablet 1  . aspirin EC 81 MG tablet Take 1 tablet (81 mg total) by mouth daily. (Patient not taking: Reported on 05/01/2019) 90 tablet 11   No current facility-administered medications on file prior to visit.     Allergies  Allergen Reactions  .  Penicillins Cross Reactors Rash  . Sulfa Antibiotics Rash  . Other Diarrhea    "mycin"     Family History  Problem Relation Age of Onset  . Heart disease Mother   . Stroke Mother   . Alcohol abuse Father   . Cancer Maternal Grandmother        colon  . Colon cancer Maternal Grandmother   . Colon polyps Brother   . Breast cancer Neg Hx   . Esophageal cancer Neg Hx   . Rectal cancer Neg Hx   . Stomach cancer Neg Hx   . Hypercalcemia Neg Hx     BP (!) 142/78 (BP Location: Right Arm, Patient Position: Sitting, Cuff Size: Normal)   Pulse (!) 108   Ht 5\' 4"  (1.626 m)   Wt 152 lb 12.8 oz (69.3 kg)   SpO2 97%   BMI 26.23 kg/m    Review of Systems Denies weight loss, visual loss, cough, edema, diarrhea, sore throat, polyuria, hematuria, rash, depression, numbness, and back pain.       Objective:   Physical Exam VS: see vs page GEN: no distress HEAD: head: no deformity eyes: no periorbital swelling, no  proptosis external nose and ears are normal NECK: supple, thyroid is not enlarged CHEST WALL: no deformity.  No kyphosis. LUNGS: clear to auscultation CV: reg rate and rhythm, no murmur ABD: abdomen is soft, nontender.  no hepatosplenomegaly.  not distended.  no hernia MUSCULOSKELETAL: muscle bulk and strength are grossly normal.  no obvious joint swelling.  gait is normal and steady EXTEMITIES: no deformity.  Trace bilat leg edema PULSES: no carotid bruit NEURO:  cn 2-12 grossly intact.   readily moves all 4's.  sensation is intact to touch on all 4's SKIN:  Normal texture and temperature.  No rash or suspicious lesion is visible.   NODES:  None palpable at the neck.  PSYCH: alert, well-oriented.  Does not appear anxious nor depressed.   Lab Results  Component Value Date   PTH 52 03/28/2019   CALCIUM 10.8 (H) 03/28/2019    25-OH vit-D=39   Lab Results  Component Value Date   ALT 17 03/27/2019   AST 18 03/27/2019   ALKPHOS 97 03/27/2019   BILITOT 0.6 03/27/2019   I have reviewed outside records, and summarized:  Pt was noted to have elevated Ca++, and referred here. Wellness, dyslipidemia, osteoporosis, and IGT were also addressed     Assessment & Plan:  Hypercalcemia, new to me, uncertain etiology   Patient Instructions  Blood tests are requested for you today.  We'll let you know about the results.  If these are normal, the next step is to check a 24-HR urine for calcium, to see if the kidneys are properly eliminating calcium.

## 2019-05-10 LAB — PTH, INTACT AND CALCIUM
Calcium: 11 mg/dL — ABNORMAL HIGH (ref 8.6–10.4)
PTH: 51 pg/mL (ref 14–64)

## 2019-05-10 LAB — PROTEIN ELECTROPHORESIS, SERUM
Albumin ELP: 4.3 g/dL (ref 3.8–4.8)
Alpha 1: 0.3 g/dL (ref 0.2–0.3)
Alpha 2: 0.7 g/dL (ref 0.5–0.9)
Beta 2: 0.3 g/dL (ref 0.2–0.5)
Beta Globulin: 0.5 g/dL (ref 0.4–0.6)
Gamma Globulin: 0.8 g/dL (ref 0.8–1.7)
Total Protein: 7 g/dL (ref 6.1–8.1)

## 2019-05-10 LAB — PTH-RELATED PEPTIDE: PTH-Related Protein (PTH-RP): 13 pg/mL — ABNORMAL LOW (ref 14–27)

## 2019-05-10 LAB — VITAMIN A: Vitamin A (Retinoic Acid): 40 ug/dL (ref 38–98)

## 2019-05-10 LAB — VITAMIN D 1,25 DIHYDROXY
Vitamin D 1, 25 (OH)2 Total: 74 pg/mL — ABNORMAL HIGH (ref 18–72)
Vitamin D2 1, 25 (OH)2: <8 pg/mL
Vitamin D3 1, 25 (OH)2: 74 pg/mL

## 2019-05-11 ENCOUNTER — Other Ambulatory Visit: Payer: Self-pay | Admitting: Endocrinology

## 2019-05-12 ENCOUNTER — Other Ambulatory Visit: Payer: Self-pay

## 2019-05-14 ENCOUNTER — Other Ambulatory Visit: Payer: PPO

## 2019-05-15 ENCOUNTER — Encounter: Payer: Self-pay | Admitting: Endocrinology

## 2019-05-15 ENCOUNTER — Encounter: Payer: Self-pay | Admitting: Internal Medicine

## 2019-05-15 DIAGNOSIS — Z13820 Encounter for screening for osteoporosis: Secondary | ICD-10-CM

## 2019-05-15 DIAGNOSIS — Z780 Asymptomatic menopausal state: Secondary | ICD-10-CM

## 2019-05-15 LAB — CALCIUM, URINE, 24 HOUR: Calcium, 24H Urine: 195 mg/24 h

## 2019-05-15 NOTE — Telephone Encounter (Signed)
Please review and advise.

## 2019-05-16 ENCOUNTER — Other Ambulatory Visit: Payer: Self-pay

## 2019-05-26 ENCOUNTER — Ambulatory Visit (INDEPENDENT_AMBULATORY_CARE_PROVIDER_SITE_OTHER)
Admission: RE | Admit: 2019-05-26 | Discharge: 2019-05-26 | Disposition: A | Discharge status: Home / Self Care | Payer: PPO | Source: Ambulatory Visit

## 2019-05-26 ENCOUNTER — Other Ambulatory Visit: Payer: Self-pay

## 2019-05-26 ENCOUNTER — Other Ambulatory Visit: Payer: PPO

## 2019-05-26 DIAGNOSIS — Z780 Asymptomatic menopausal state: Secondary | ICD-10-CM

## 2019-05-26 DIAGNOSIS — Z13820 Encounter for screening for osteoporosis: Secondary | ICD-10-CM

## 2019-05-28 ENCOUNTER — Ambulatory Visit (INDEPENDENT_AMBULATORY_CARE_PROVIDER_SITE_OTHER): Payer: PPO

## 2019-05-28 ENCOUNTER — Other Ambulatory Visit: Payer: Self-pay

## 2019-05-28 DIAGNOSIS — E538 Deficiency of other specified B group vitamins: Secondary | ICD-10-CM | POA: Diagnosis not present

## 2019-05-28 MED ORDER — CYANOCOBALAMIN 1000 MCG/ML IJ SOLN
1000.0000 ug | Freq: Once | INTRAMUSCULAR | Status: AC
  Administered 2019-05-28: 1000 ug via INTRAMUSCULAR

## 2019-06-03 NOTE — Progress Notes (Signed)
Sent to Dr. Jerilee Hoh with [facility] as requested.

## 2019-06-04 ENCOUNTER — Other Ambulatory Visit: Payer: Self-pay

## 2019-06-05 ENCOUNTER — Encounter: Payer: Self-pay | Admitting: Internal Medicine

## 2019-06-05 ENCOUNTER — Ambulatory Visit (INDEPENDENT_AMBULATORY_CARE_PROVIDER_SITE_OTHER): Payer: PPO | Admitting: Internal Medicine

## 2019-06-05 DIAGNOSIS — M810 Age-related osteoporosis without current pathological fracture: Secondary | ICD-10-CM | POA: Diagnosis not present

## 2019-06-05 NOTE — Progress Notes (Signed)
Established Patient Office Visit     This visit occurred during the SARS-CoV-2 public health emergency.  Safety protocols were in place, including screening questions prior to the visit, additional usage of staff PPE, and extensive cleaning of exam room while observing appropriate contact time as indicated for disinfecting solutions.    CC/Reason for Visit: To discuss hypercalcemia and osteoporosis  HPI: Tonya Cherry is a 68 y.o. female who is coming in today for the above mentioned reasons.  I noted she was hypercalcemic during her physical labs earlier this year.  Review of records showed that her calcium level has been high for years.  PTH, although within normal range, was a little bit higher than what I would anticipate for her calcium level, I referred her to endocrinology for further evaluation.  Important to note that she does have a history of osteoporosis with a T score of -2.7.  She was unable to tolerate oral bisphosphonates due to acid reflux issues.  She has been sent to the Prolia portal.  She was seen by Dr. Loanne Drilling.  He is fine at this time is to monitor her and have her return in 6 months for further follow-up.  Past Medical/Surgical History: Past Medical History:  Diagnosis Date  . Anxiety   . COPD (chronic obstructive pulmonary disease) ([place])   . Depression   . GERD (gastroesophageal reflux disease)   . Hyperlipidemia   . Osteoporosis   . Thyroid disease   . Ulcer     Past Surgical History:  Procedure Laterality Date  . CESAREAN SECTION      Social History:  reports that she has been smoking cigarettes. She has been smoking about 1.00 pack per day. She has never used smokeless tobacco. She reports that she does not drink alcohol or use drugs.  Allergies: Allergies  Allergen Reactions  . Penicillins Cross Reactors Rash  . Sulfa Antibiotics Rash  . Other Diarrhea    "mycin"     Family History:  Family History  Problem Relation Age of Onset  .  Heart disease Mother   . Stroke Mother   . Alcohol abuse Father   . Cancer Maternal Grandmother        colon  . Colon cancer Maternal Grandmother   . Colon polyps Brother   . Breast cancer Neg Hx   . Esophageal cancer Neg Hx   . Rectal cancer Neg Hx   . Stomach cancer Neg Hx   . Hypercalcemia Neg Hx      Current Outpatient Medications:  .  ALPRAZolam (XANAX) 0.5 MG tablet, TAKE 1 TABLET (0.5 MG TOTAL) BY MOUTH 2 (TWO) TIMES DAILY AS NEEDED, Disp: 60 tablet, Rfl: 1 .  aspirin EC 81 MG tablet, Take 1 tablet (81 mg total) by mouth daily., Disp: 90 tablet, Rfl: 11 .  atorvastatin (LIPITOR) 20 MG tablet, Take 1 tablet (20 mg total) by mouth daily., Disp: 90 tablet, Rfl: 3 .  cholecalciferol (VITAMIN D3) 25 MCG (1000 UT) tablet, Take 5,000 Units by mouth every other day. , Disp: , Rfl:  .  levothyroxine (SYNTHROID) 75 MCG tablet, Take 1 tablet (75 mcg total) by mouth daily., Disp: 90 tablet, Rfl: 1  Review of Systems:  Constitutional: Denies fever, chills, diaphoresis, appetite change and fatigue.  HEENT: Denies photophobia, eye pain, redness, hearing loss, ear pain, congestion, sore throat, rhinorrhea, sneezing, mouth sores, trouble swallowing, neck pain, neck stiffness and tinnitus.   Respiratory: Denies SOB, DOE, cough, chest  tightness,  and wheezing.   Cardiovascular: Denies chest pain, palpitations and leg swelling.  Gastrointestinal: Denies nausea, vomiting, abdominal pain, diarrhea, constipation, blood in stool and abdominal distention.  Genitourinary: Denies dysuria, urgency, frequency, hematuria, flank pain and difficulty urinating.  Endocrine: Denies: hot or cold intolerance, sweats, changes in hair or nails, polyuria, polydipsia. Musculoskeletal: Denies myalgias, back pain, joint swelling, arthralgias and gait problem.  Skin: Denies pallor, rash and wound.  Neurological: Denies dizziness, seizures, syncope, weakness, light-headedness, numbness and headaches.  Hematological:  Denies adenopathy. Easy bruising, personal or family bleeding history  Psychiatric/Behavioral: Denies suicidal ideation, mood changes, confusion, nervousness, sleep disturbance and agitation    Physical Exam: Vitals:   06/05/19 1125  BP: 120/62  Pulse: 86  Temp: 97.7 F (36.5 C)  TempSrc: Temporal  SpO2: 95%  Weight: 155 lb (70.3 kg)    Body mass index is 26.61 kg/m.   Constitutional: NAD, calm, comfortable Eyes: PERRL, lids and conjunctivae normal ENMT: Mucous membranes are moist.  Respiratory: clear to auscultation bilaterally, no wheezing, no crackles. Normal respiratory effort. No accessory muscle use.  Cardiovascular: Regular rate and rhythm, no murmurs / rubs / gallops. No extremity edema. 2+ pedal pulses.   Psychiatric: Normal judgment and insight. Alert and oriented x 3. Normal mood.    Impression and Plan:  Hypercalcemia -Being followed by endocrine. -If she does end up having primary hyperparathyroidism, I believe she would be a candidate for surgery given her history of osteoporosis.  Osteoporosis without current pathological fracture, unspecified osteoporosis type -Unable to tolerate oral bisphosphonates, will look into Prolia for her.      Lelon Frohlich, MD [facility]

## 2019-06-18 ENCOUNTER — Telehealth: Payer: Self-pay | Admitting: *Deleted

## 2019-06-18 NOTE — Telephone Encounter (Signed)
-----   Message from Erline Hau, MD sent at 06/05/2019 12:11 PM EST ----- Can we look into Prolia for her?  Tonya Cherry

## 2019-06-18 NOTE — Telephone Encounter (Signed)
Insurance verification started for Prolia 

## 2019-06-23 ENCOUNTER — Other Ambulatory Visit: Payer: Self-pay | Admitting: Internal Medicine

## 2019-06-23 DIAGNOSIS — F419 Anxiety disorder, unspecified: Secondary | ICD-10-CM

## 2019-06-23 DIAGNOSIS — E785 Hyperlipidemia, unspecified: Secondary | ICD-10-CM

## 2019-06-23 NOTE — Telephone Encounter (Signed)
Last Rx given on 11/3 for #60 with 1 ref

## 2019-06-24 NOTE — Telephone Encounter (Signed)
Spoke with patient and the cost for a Prolia injections is roughly $230 plus $35 for admin fee.  Information given about the savings plan.  Patient will call back if interested.

## 2019-06-30 ENCOUNTER — Other Ambulatory Visit: Payer: Self-pay

## 2019-06-30 ENCOUNTER — Ambulatory Visit (INDEPENDENT_AMBULATORY_CARE_PROVIDER_SITE_OTHER): Payer: PPO | Admitting: *Deleted

## 2019-06-30 DIAGNOSIS — E538 Deficiency of other specified B group vitamins: Secondary | ICD-10-CM

## 2019-06-30 MED ORDER — CYANOCOBALAMIN 1000 MCG/ML IJ SOLN: 1000.0000 ug | Freq: Once | INTRAMUSCULAR | 0 refills | Status: DC

## 2019-06-30 MED ORDER — CYANOCOBALAMIN 1000 MCG/ML IJ SOLN
1000.0000 ug | Freq: Once | INTRAMUSCULAR | Status: AC
  Administered 2019-06-30: 1000 ug via INTRAMUSCULAR

## 2019-06-30 NOTE — Progress Notes (Signed)
Per orders of Dr. Burchette, injection of Cyanocobalamin 1000mcg given by Alleya Demeter A. Patient tolerated injection well.  

## 2019-07-15 ENCOUNTER — Encounter: Payer: Self-pay | Admitting: Internal Medicine

## 2019-07-31 ENCOUNTER — Other Ambulatory Visit: Payer: Self-pay

## 2019-07-31 ENCOUNTER — Ambulatory Visit (INDEPENDENT_AMBULATORY_CARE_PROVIDER_SITE_OTHER): Payer: PPO | Admitting: *Deleted

## 2019-07-31 DIAGNOSIS — E538 Deficiency of other specified B group vitamins: Secondary | ICD-10-CM | POA: Diagnosis not present

## 2019-07-31 MED ORDER — CYANOCOBALAMIN 1000 MCG/ML IJ SOLN
1000.0000 ug | Freq: Once | INTRAMUSCULAR | Status: AC
  Administered 2019-07-31: 1000 ug via INTRAMUSCULAR

## 2019-07-31 NOTE — Progress Notes (Signed)
Per orders of Cory Nafziger NP, injection of B12 given by Emrick Hensch. Patient tolerated injection well. 

## 2019-08-20 ENCOUNTER — Other Ambulatory Visit: Payer: Self-pay | Admitting: Internal Medicine

## 2019-08-20 DIAGNOSIS — F419 Anxiety disorder, unspecified: Secondary | ICD-10-CM

## 2019-08-27 ENCOUNTER — Other Ambulatory Visit: Payer: Self-pay

## 2019-08-28 ENCOUNTER — Ambulatory Visit (INDEPENDENT_AMBULATORY_CARE_PROVIDER_SITE_OTHER): Payer: PPO

## 2019-08-28 DIAGNOSIS — E538 Deficiency of other specified B group vitamins: Secondary | ICD-10-CM

## 2019-08-28 MED ORDER — CYANOCOBALAMIN 1000 MCG/ML IJ SOLN
1000.0000 ug | Freq: Once | INTRAMUSCULAR | Status: AC
  Administered 2019-08-28: 1000 ug via INTRAMUSCULAR

## 2019-08-28 NOTE — Patient Instructions (Signed)
Health Maintenance Due  Topic Date Due  . TETANUS/TDAP  03/01/1971  . COLONOSCOPY  02/28/2002  . INFLUENZA VACCINE  01/25/2019    Depression screen [facility] 2/9 03/27/2019 08/29/2018 05/30/2018  Decreased Interest 0 0 0  Down, Depressed, Hopeless 3 1 0  PHQ - 2 Score 3 1 0  Altered sleeping 0 - 0  Tired, decreased energy 0 - 0  Change in appetite 0 - 0  Feeling bad or failure about yourself  0 - 0  Trouble concentrating 1 - 0  Moving slowly or fidgety/restless 0 - 0  Suicidal thoughts 0 - 0  PHQ-9 Score 4 - 0  Difficult doing work/chores Not difficult at all - -

## 2019-08-28 NOTE — Progress Notes (Signed)
Per orders of Dr. Hernandez, injection of B12 given by Starkeisha Vanwinkle R Jasmia Angst. Patient tolerated injection well. 

## 2019-08-29 ENCOUNTER — Encounter: Payer: Self-pay | Admitting: Internal Medicine

## 2019-09-03 ENCOUNTER — Encounter: Payer: Self-pay | Admitting: Endocrinology

## 2019-09-04 NOTE — Telephone Encounter (Signed)
Patient is no longer interested in Prolia.

## 2019-09-08 ENCOUNTER — Telehealth: Payer: Self-pay | Admitting: Internal Medicine

## 2019-09-08 NOTE — Telephone Encounter (Signed)
Pt is scheduled to receive her 2nd covid vaccine on 4/8 and she is due to get her B12 inj the same day. Pt is wondering if it is ok to receive both within the same week/day?   Pt can be reached at [phone] -ok to leave detailed message per pt

## 2019-09-09 NOTE — Telephone Encounter (Signed)
Patient is aware 

## 2019-09-18 ENCOUNTER — Other Ambulatory Visit: Payer: Self-pay | Admitting: Internal Medicine

## 2019-10-02 ENCOUNTER — Ambulatory Visit: Payer: PPO

## 2019-10-08 ENCOUNTER — Other Ambulatory Visit: Payer: Self-pay

## 2019-10-09 ENCOUNTER — Ambulatory Visit (INDEPENDENT_AMBULATORY_CARE_PROVIDER_SITE_OTHER): Payer: PPO | Admitting: *Deleted

## 2019-10-09 DIAGNOSIS — E538 Deficiency of other specified B group vitamins: Secondary | ICD-10-CM | POA: Diagnosis not present

## 2019-10-09 MED ORDER — CYANOCOBALAMIN 1000 MCG/ML IJ SOLN
1000.0000 ug | Freq: Once | INTRAMUSCULAR | Status: AC
  Administered 2019-10-09: 1000 ug via INTRAMUSCULAR

## 2019-10-09 NOTE — Progress Notes (Signed)
Per orders of Dr. Hernandez, injection of Cyanocobalamin 1000mcg given by Mixtli Reno A. Patient tolerated injection well. 

## 2019-10-17 ENCOUNTER — Other Ambulatory Visit: Payer: Self-pay | Admitting: Family

## 2019-10-17 DIAGNOSIS — F419 Anxiety disorder, unspecified: Secondary | ICD-10-CM

## 2019-10-20 ENCOUNTER — Other Ambulatory Visit: Payer: Self-pay | Admitting: Internal Medicine

## 2019-10-20 DIAGNOSIS — F419 Anxiety disorder, unspecified: Secondary | ICD-10-CM

## 2019-10-20 NOTE — Telephone Encounter (Signed)
  pt requesting a refill on ALPRAZolam (XANAX) 0.5 MG tablet [facility] #[id number] - [place], [place] - [address]  Phone:  [phone] Fax:  [phone]   contact pt  [phone]

## 2019-10-21 MED ORDER — ALPRAZOLAM 0.5 MG PO TABS: 0.5000 mg | ORAL_TABLET | Freq: Two times a day (BID) | ORAL | 1 refills | Status: DC | PRN

## 2019-10-21 NOTE — Telephone Encounter (Signed)
Rx sent 

## 2019-11-12 ENCOUNTER — Other Ambulatory Visit: Payer: Self-pay

## 2019-11-13 ENCOUNTER — Ambulatory Visit (INDEPENDENT_AMBULATORY_CARE_PROVIDER_SITE_OTHER): Payer: PPO

## 2019-11-13 DIAGNOSIS — E538 Deficiency of other specified B group vitamins: Secondary | ICD-10-CM

## 2019-11-13 MED ORDER — CYANOCOBALAMIN 1000 MCG/ML IJ SOLN
1000.0000 ug | Freq: Once | INTRAMUSCULAR | Status: AC
  Administered 2019-11-13: 1000 ug via INTRAMUSCULAR

## 2019-11-13 NOTE — Patient Instructions (Signed)
Health Maintenance Due  Topic Date Due  . COVID-19 Vaccine (1) Never done  . TETANUS/TDAP  Never done  . COLONOSCOPY  Never done    Depression screen [facility] 2/9 03/27/2019 08/29/2018 05/30/2018  Decreased Interest 0 0 0  Down, Depressed, Hopeless 3 1 0  PHQ - 2 Score 3 1 0  Altered sleeping 0 - 0  Tired, decreased energy 0 - 0  Change in appetite 0 - 0  Feeling bad or failure about yourself  0 - 0  Trouble concentrating 1 - 0  Moving slowly or fidgety/restless 0 - 0  Suicidal thoughts 0 - 0  PHQ-9 Score 4 - 0  Difficult doing work/chores Not difficult at all - -   

## 2019-11-13 NOTE — Progress Notes (Signed)
Per orders of , injection of B12 given in Right deltoid by Efren Kross R Persephanie Laatsch. Patient tolerated injection well.  

## 2019-12-18 ENCOUNTER — Other Ambulatory Visit: Payer: Self-pay

## 2019-12-18 ENCOUNTER — Ambulatory Visit (INDEPENDENT_AMBULATORY_CARE_PROVIDER_SITE_OTHER): Payer: PPO

## 2019-12-18 DIAGNOSIS — E538 Deficiency of other specified B group vitamins: Secondary | ICD-10-CM

## 2019-12-18 MED ORDER — CYANOCOBALAMIN 1000 MCG/ML IJ SOLN
1000.0000 ug | Freq: Once | INTRAMUSCULAR | Status: AC
  Administered 2019-12-18: 1000 ug via INTRAMUSCULAR

## 2019-12-18 NOTE — Patient Instructions (Signed)
Health Maintenance Due  Topic Date Due  . COVID-19 Vaccine (1) Never done  . TETANUS/TDAP  Never done  . COLONOSCOPY  Never done    Depression screen [facility] 2/9 03/27/2019 08/29/2018 05/30/2018  Decreased Interest 0 0 0  Down, Depressed, Hopeless 3 1 0  PHQ - 2 Score 3 1 0  Altered sleeping 0 - 0  Tired, decreased energy 0 - 0  Change in appetite 0 - 0  Feeling bad or failure about yourself  0 - 0  Trouble concentrating 1 - 0  Moving slowly or fidgety/restless 0 - 0  Suicidal thoughts 0 - 0  PHQ-9 Score 4 - 0  Difficult doing work/chores Not difficult at all - -

## 2019-12-18 NOTE — Progress Notes (Addendum)
Per orders of Dr. Shelton Silvas, injection of B12 given in Right deltoid by Franco Collet. Patient tolerated injection well.   Injection was not given in the pt Left arm due to pt stating that she had it in the opposite side due to bruise left by Covid vaccine.

## 2019-12-27 ENCOUNTER — Other Ambulatory Visit: Payer: Self-pay | Admitting: Internal Medicine

## 2019-12-27 DIAGNOSIS — E785 Hyperlipidemia, unspecified: Secondary | ICD-10-CM

## 2020-01-19 ENCOUNTER — Other Ambulatory Visit: Payer: Self-pay

## 2020-01-19 ENCOUNTER — Ambulatory Visit (INDEPENDENT_AMBULATORY_CARE_PROVIDER_SITE_OTHER): Payer: PPO

## 2020-01-19 DIAGNOSIS — E538 Deficiency of other specified B group vitamins: Secondary | ICD-10-CM | POA: Diagnosis not present

## 2020-01-19 MED ORDER — CYANOCOBALAMIN 1000 MCG/ML IJ SOLN
1000.0000 ug | Freq: Once | INTRAMUSCULAR | Status: AC
  Administered 2020-01-19: 1000 ug via INTRAMUSCULAR

## 2020-01-19 NOTE — Progress Notes (Signed)
Per orders of Dr.Hernandez , injection of B12 given in left deltoid by Franco Collet. Patient tolerated injection well.

## 2020-01-19 NOTE — Patient Instructions (Signed)
Health Maintenance Due  Topic Date Due  . COVID-19 Vaccine (1) Never done  . TETANUS/TDAP  Never done  . COLONOSCOPY  Never done    Depression screen [facility] 2/9 03/27/2019 08/29/2018 05/30/2018  Decreased Interest 0 0 0  Down, Depressed, Hopeless 3 1 0  PHQ - 2 Score 3 1 0  Altered sleeping 0 - 0  Tired, decreased energy 0 - 0  Change in appetite 0 - 0  Feeling bad or failure about yourself  0 - 0  Trouble concentrating 1 - 0  Moving slowly or fidgety/restless 0 - 0  Suicidal thoughts 0 - 0  PHQ-9 Score 4 - 0  Difficult doing work/chores Not difficult at all - -

## 2020-02-16 ENCOUNTER — Other Ambulatory Visit: Payer: Self-pay | Admitting: Internal Medicine

## 2020-02-16 DIAGNOSIS — F419 Anxiety disorder, unspecified: Secondary | ICD-10-CM

## 2020-02-19 ENCOUNTER — Ambulatory Visit (INDEPENDENT_AMBULATORY_CARE_PROVIDER_SITE_OTHER): Payer: PPO | Admitting: *Deleted

## 2020-02-19 ENCOUNTER — Other Ambulatory Visit: Payer: Self-pay

## 2020-02-19 DIAGNOSIS — E538 Deficiency of other specified B group vitamins: Secondary | ICD-10-CM

## 2020-02-19 MED ORDER — CYANOCOBALAMIN 1000 MCG/ML IJ SOLN
1000.0000 ug | Freq: Once | INTRAMUSCULAR | Status: AC
  Administered 2020-02-19: 1000 ug via INTRAMUSCULAR

## 2020-02-19 NOTE — Progress Notes (Signed)
Per orders of Dr. Jerilee Hoh, injection of monthly B 12 given by Zacarias Pontes. Patient tolerated injection well.

## 2020-03-16 ENCOUNTER — Other Ambulatory Visit: Payer: Self-pay | Admitting: Adult Health

## 2020-03-16 DIAGNOSIS — F419 Anxiety disorder, unspecified: Secondary | ICD-10-CM

## 2020-03-25 ENCOUNTER — Other Ambulatory Visit: Payer: Self-pay

## 2020-03-25 ENCOUNTER — Ambulatory Visit (INDEPENDENT_AMBULATORY_CARE_PROVIDER_SITE_OTHER): Payer: PPO

## 2020-03-25 DIAGNOSIS — E538 Deficiency of other specified B group vitamins: Secondary | ICD-10-CM

## 2020-03-25 MED ORDER — CYANOCOBALAMIN 1000 MCG/ML IJ SOLN
1000.0000 ug | Freq: Once | INTRAMUSCULAR | Status: AC
  Administered 2020-03-25: 1000 ug via INTRAMUSCULAR

## 2020-03-25 NOTE — Progress Notes (Signed)
Per orders of Dr. Jerilee Hoh, injection of monthly B 12 given by [facility] F in Left Deltoid.  Patient tolerated the injection well.

## 2020-04-09 DIAGNOSIS — L301 Dyshidrosis [pompholyx]: Secondary | ICD-10-CM | POA: Diagnosis not present

## 2020-04-09 DIAGNOSIS — L814 Other melanin hyperpigmentation: Secondary | ICD-10-CM | POA: Diagnosis not present

## 2020-04-09 DIAGNOSIS — D485 Neoplasm of uncertain behavior of skin: Secondary | ICD-10-CM | POA: Diagnosis not present

## 2020-04-09 DIAGNOSIS — C44519 Basal cell carcinoma of skin of other part of trunk: Secondary | ICD-10-CM | POA: Diagnosis not present

## 2020-04-09 DIAGNOSIS — L821 Other seborrheic keratosis: Secondary | ICD-10-CM | POA: Diagnosis not present

## 2020-04-13 ENCOUNTER — Other Ambulatory Visit: Payer: Self-pay | Admitting: Internal Medicine

## 2020-04-13 DIAGNOSIS — F419 Anxiety disorder, unspecified: Secondary | ICD-10-CM

## 2020-04-22 ENCOUNTER — Encounter: Payer: Self-pay | Admitting: Internal Medicine

## 2020-04-22 ENCOUNTER — Ambulatory Visit (INDEPENDENT_AMBULATORY_CARE_PROVIDER_SITE_OTHER): Payer: PPO | Admitting: Internal Medicine

## 2020-04-22 ENCOUNTER — Other Ambulatory Visit: Payer: Self-pay

## 2020-04-22 ENCOUNTER — Ambulatory Visit (INDEPENDENT_AMBULATORY_CARE_PROVIDER_SITE_OTHER): Payer: PPO

## 2020-04-22 VITALS — BP 140/80 | HR 98 | Temp 98.5°F | Ht 63.5 in | Wt 156.6 lb

## 2020-04-22 DIAGNOSIS — E039 Hypothyroidism, unspecified: Secondary | ICD-10-CM | POA: Diagnosis not present

## 2020-04-22 DIAGNOSIS — Z124 Encounter for screening for malignant neoplasm of cervix: Secondary | ICD-10-CM

## 2020-04-22 DIAGNOSIS — R7302 Impaired glucose tolerance (oral): Secondary | ICD-10-CM

## 2020-04-22 DIAGNOSIS — E559 Vitamin D deficiency, unspecified: Secondary | ICD-10-CM

## 2020-04-22 DIAGNOSIS — Z720 Tobacco use: Secondary | ICD-10-CM

## 2020-04-22 DIAGNOSIS — M810 Age-related osteoporosis without current pathological fracture: Secondary | ICD-10-CM | POA: Diagnosis not present

## 2020-04-22 DIAGNOSIS — R000 Tachycardia, unspecified: Secondary | ICD-10-CM

## 2020-04-22 DIAGNOSIS — R195 Other fecal abnormalities: Secondary | ICD-10-CM

## 2020-04-22 DIAGNOSIS — G8929 Other chronic pain: Secondary | ICD-10-CM

## 2020-04-22 DIAGNOSIS — M25512 Pain in left shoulder: Secondary | ICD-10-CM

## 2020-04-22 DIAGNOSIS — Z1211 Encounter for screening for malignant neoplasm of colon: Secondary | ICD-10-CM | POA: Diagnosis not present

## 2020-04-22 DIAGNOSIS — Z0000 Encounter for general adult medical examination without abnormal findings: Secondary | ICD-10-CM | POA: Diagnosis not present

## 2020-04-22 DIAGNOSIS — E785 Hyperlipidemia, unspecified: Secondary | ICD-10-CM | POA: Diagnosis not present

## 2020-04-22 DIAGNOSIS — Z1231 Encounter for screening mammogram for malignant neoplasm of breast: Secondary | ICD-10-CM

## 2020-04-22 DIAGNOSIS — I6529 Occlusion and stenosis of unspecified carotid artery: Secondary | ICD-10-CM | POA: Diagnosis not present

## 2020-04-22 DIAGNOSIS — Z122 Encounter for screening for malignant neoplasm of respiratory organs: Secondary | ICD-10-CM

## 2020-04-22 NOTE — Progress Notes (Signed)
Established Patient Office Visit     This visit occurred during the SARS-CoV-2 public health emergency.  Safety protocols were in place, including screening questions prior to the visit, additional usage of staff PPE, and extensive cleaning of exam room while observing appropriate contact time as indicated for disinfecting solutions.    CC/Reason for Visit: Annual preventive exam and subsequent Medicare wellness visit  HPI: Tonya Cherry is a 69 y.o. female who is coming in today for the above mentioned reasons. Past Medical History is significant for: Hyperlipidemia, minor left ICA stenosis that was 1 to 39% per ultrasound in 2018, continued tobacco abuse of a pack a day, hypothyroidism, generalized anxiety disorder, vitamin D deficiency, history of osteoporosis and hypercalcemia that is being followed by endocrinology.  She has had a positive Cologuard and had been referred to GI for screening colonoscopy which she has delayed because of the pandemic.  She states that her anxiety "is out of control".  She feels like her heart is racing all the time.  She has a gynecologist.  Her mammogram is out dated, she is due for her Covid booster, flu vaccine, Tdap, shingles.   Past Medical/Surgical History: Past Medical History:  Diagnosis Date  . Anxiety   . COPD (chronic obstructive pulmonary disease) ([place])   . Depression   . GERD (gastroesophageal reflux disease)   . Hyperlipidemia   . Osteoporosis   . Thyroid disease   . Ulcer     Past Surgical History:  Procedure Laterality Date  . CESAREAN SECTION      Social History:  reports that she has been smoking cigarettes. She has been smoking about 1.00 pack per day. She has never used smokeless tobacco. She reports that she does not drink alcohol and does not use drugs.  Allergies: Allergies  Allergen Reactions  . Penicillins Cross Reactors Rash  . Sulfa Antibiotics Rash  . Other Diarrhea    "mycin"     Family History:   Family History  Problem Relation Age of Onset  . Heart disease Mother   . Stroke Mother   . Alcohol abuse Father   . Cancer Maternal Grandmother        colon  . Colon cancer Maternal Grandmother   . Colon polyps Brother   . Breast cancer Neg Hx   . Esophageal cancer Neg Hx   . Rectal cancer Neg Hx   . Stomach cancer Neg Hx   . Hypercalcemia Neg Hx      Current Outpatient Medications:  .  ALPRAZolam (XANAX) 0.5 MG tablet, TAKE 1 TABLET (0.5 MG TOTAL) BY MOUTH 2 (TWO) TIMES DAILY AS NEEDED., Disp: 60 tablet, Rfl: 0 .  aspirin EC 81 MG tablet, Take 1 tablet (81 mg total) by mouth daily., Disp: 90 tablet, Rfl: 11 .  atorvastatin (LIPITOR) 20 MG tablet, TAKE 1 TABLET BY MOUTH EVERY DAY, Disp: 90 tablet, Rfl: 1 .  cholecalciferol (VITAMIN D3) 25 MCG (1000 UT) tablet, Take 5,000 Units by mouth every other day. , Disp: , Rfl:  .  levothyroxine (SYNTHROID) 75 MCG tablet, TAKE 1 TABLET BY MOUTH EVERY DAY, Disp: 90 tablet, Rfl: 1  Review of Systems:  Constitutional: Denies fever, chills, diaphoresis, appetite change and fatigue.  HEENT: Denies photophobia, eye pain, redness, hearing loss, ear pain, congestion, sore throat, rhinorrhea, sneezing, mouth sores, trouble swallowing, neck pain, neck stiffness and tinnitus.   Respiratory: Denies SOB, DOE, cough, chest tightness,  and wheezing.   Cardiovascular: Denies  chest pain, palpitations and leg swelling.  Gastrointestinal: Denies nausea, vomiting, abdominal pain, diarrhea, constipation, blood in stool and abdominal distention.  Genitourinary: Denies dysuria, urgency, frequency, hematuria, flank pain and difficulty urinating.  Endocrine: Denies: hot or cold intolerance, sweats, changes in hair or nails, polyuria, polydipsia. Musculoskeletal: Denies myalgias, back pain, joint swelling, arthralgias and gait problem.  Skin: Denies pallor, rash and wound.  Neurological: Denies dizziness, seizures, syncope, weakness, light-headedness, numbness and  headaches.  Hematological: Denies adenopathy. Easy bruising, personal or family bleeding history  Psychiatric/Behavioral: Denies suicidal ideation, mood changes, confusion, sleep disturbance and agitation    Physical Exam: Vitals:   04/22/20 1304  BP: (!) 160/80  Pulse: (!) 128  Temp: 98.5 F (36.9 C)  TempSrc: Oral  SpO2: 96%  Weight: 156 lb 9.6 oz (71 kg)  Height: 5' 3.5" (1.613 m)    Body mass index is 27.31 kg/m.   Constitutional: NAD, calm, comfortable Eyes: PERRL, lids and conjunctivae normal ENMT: Mucous membranes are moist. Posterior pharynx clear of any exudate or lesions. Normal dentition. Tympanic membrane is pearly white, no erythema or bulging. Neck: normal, supple, no masses, no thyromegaly Respiratory: clear to auscultation bilaterally, no wheezing, no crackles. Normal respiratory effort. No accessory muscle use.  Cardiovascular: Regular rate and rhythm, no murmurs / rubs / gallops. No extremity edema. 2+ pedal pulses. No carotid bruits.  Abdomen: no tenderness, no masses palpated. No hepatosplenomegaly. Bowel sounds positive.  Musculoskeletal: no clubbing / cyanosis. No joint deformity upper and lower extremities. Good ROM, no contractures. Normal muscle tone.  Skin: no rashes, lesions, ulcers. No induration Neurologic: CN 2-12 grossly intact. Sensation intact, DTR normal. Strength 5/5 in all 4.  Psychiatric: Normal judgment and insight. Alert and oriented x 3. Normal mood.    Subsequent Medicare wellness visit   1. Risk factors, based on past  M,S,F -cardiovascular disease risk factors include age, history of hyperlipidemia, ongoing tobacco abuse   2.  Physical activities: Very sedentary   3.  Depression/mood:  Not depressed but certainly anxious   4.  Hearing:  Mild hearing loss left ear but declines audiology referral   5.  ADL's: Independent in all ADLs   6.  Fall risk:  Low fall risk   7.  Home safety: No problems identified   8.  Height  weight, and visual acuity: height and weight as above, vision:   Visual Acuity Screening   Right eye Left eye Both eyes  Without correction: 20/40 20/50 20/32   With correction:        9.  Counseling:  Advised that we update her cancer screening especially with a positive Cologuard, have advised that she update her immunizations but she declines today.   10. Lab orders based on risk factors: Laboratory update will be reviewed   11. Referral :  None today   12. Care plan:  Follow-up with me in 6 months or sooner pending lab results   13. Cognitive assessment:  No cognitive impairment   14. Screening: Patient provided with a written and personalized 5-10 year screening schedule in the AVS.   yes   15. Provider List Update:   PCP, OB/GYN Dr. Willis Modena  16. Advance Directives: Full code     Office Visit from 04/22/2020 in [place] [facility] [place]  PHQ-9 Total Score 6      Fall Risk  04/22/2020 03/27/2019 08/29/2018 05/30/2018  Falls in the past year? 0 0 0 0  Number falls in past yr: 0 0 0  0  Injury with Fall? 0 0 0 0     Impression and Plan:  Encounter for preventive health examination -Advised routine eye and dental care. -She is due for flu, COVID booster, Tdap, shingles.  She declines all today. -Screening labs today. -Healthy lifestyle discussed in detail. -Sent back to GI for colonoscopy given positive Cologuard result. -Mammogram will be ordered today. -She has a GYN who will schedule appointment for Pap.  Hypercalcemia  -Recheck calcium levels today, she was seen by endocrinology with plans to follow-up in 6 months which has not yet happened.  Low threshold to send back to endocrinology pending lab results.  Vitamin D deficiency -Recheck levels today.  Osteoporosis without current pathological fracture, unspecified osteoporosis type -She has refused treatment.  Impaired glucose tolerance -Check A1c, last A1c was 6 in October 2020.  Acquired  hypothyroidism  - Plan: TSH -Last TSH was 2.130 in October 2020, she is on levothyroxine daily.  Dyslipidemia  - Plan: Lipid panel -Last LDL was 125 in October 2020, she remains on atorvastatin.  Tobacco abuse  -She is not interested in smoking cessation at this time, I will continue to address at subsequent visits.  Stenosis of carotid artery, unspecified laterality -Carotid Doppler in 2018 showed a 1 to 39% left ICA stenosis with recommendations to follow-up as needed.  Encounter for screening mammogram for malignant neoplasm of breast -Mammogram to be ordered  Colon cancer screening -Have discussed with her importance of colonoscopy given positive Cologuard results.  I will resend referral today.  Tachycardia -EKG in office today and interpreted by myself as sinus tachycardia at a rate of 101, mild ST depression in lead II not reproducible to other inferior leads.  Encounter for screening for lung cancer  - Plan: Ambulatory Referral for Lung Cancer Screen with low-dose CT scan due to ongoing smoking history.  Left shoulder pain -Suspect chronic arthritis, check x-rays today.    Patient Instructions  -Nice seeing you today!!  -Lab work today; will notify you once results are available.       Lelon Frohlich, MD [facility]

## 2020-04-22 NOTE — Addendum Note (Signed)
Addended by: Westley Hummer B on: 04/22/2020 02:19 PM   Modules accepted: Orders

## 2020-04-22 NOTE — Patient Instructions (Signed)
-  Nice seeing you today!!  -Lab work today; will notify you once results are available.

## 2020-04-23 ENCOUNTER — Other Ambulatory Visit: Payer: Self-pay

## 2020-04-23 ENCOUNTER — Other Ambulatory Visit: Payer: PPO

## 2020-04-23 DIAGNOSIS — E559 Vitamin D deficiency, unspecified: Secondary | ICD-10-CM | POA: Diagnosis not present

## 2020-04-23 DIAGNOSIS — E785 Hyperlipidemia, unspecified: Secondary | ICD-10-CM

## 2020-04-23 DIAGNOSIS — Z0000 Encounter for general adult medical examination without abnormal findings: Secondary | ICD-10-CM

## 2020-04-23 DIAGNOSIS — R7302 Impaired glucose tolerance (oral): Secondary | ICD-10-CM

## 2020-04-23 DIAGNOSIS — E039 Hypothyroidism, unspecified: Secondary | ICD-10-CM | POA: Diagnosis not present

## 2020-04-23 DIAGNOSIS — E538 Deficiency of other specified B group vitamins: Secondary | ICD-10-CM

## 2020-04-24 LAB — TSH: TSH: 1.95 mIU/L (ref 0.40–4.50)

## 2020-04-24 LAB — CBC WITH DIFFERENTIAL/PLATELET
Absolute Monocytes: 467 cells/uL (ref 200–950)
Basophils Absolute: 40 cells/uL (ref 0–200)
Basophils Relative: 0.7 %
Eosinophils Absolute: 171 cells/uL (ref 15–500)
Eosinophils Relative: 3 %
HCT: 43.4 % (ref 35.0–45.0)
Hemoglobin: 14.8 g/dL (ref 11.7–15.5)
Lymphs Abs: 2081 cells/uL (ref 850–3900)
MCH: 32.2 pg (ref 27.0–33.0)
MCHC: 34.1 g/dL (ref 32.0–36.0)
MCV: 94.6 fL (ref 80.0–100.0)
MPV: 9.6 fL (ref 7.5–12.5)
Monocytes Relative: 8.2 %
Neutro Abs: 2941 cells/uL (ref 1500–7800)
Neutrophils Relative %: 51.6 %
Platelets: 277 10*3/uL (ref 140–400)
RBC: 4.59 10*6/uL (ref 3.80–5.10)
RDW: 12.1 % (ref 11.0–15.0)
Total Lymphocyte: 36.5 %
WBC: 5.7 10*3/uL (ref 3.8–10.8)

## 2020-04-24 LAB — COMPREHENSIVE METABOLIC PANEL
AG Ratio: 1.6 (calc) (ref 1.0–2.5)
ALT: 15 U/L (ref 6–29)
AST: 17 U/L (ref 10–35)
Albumin: 4.2 g/dL (ref 3.6–5.1)
Alkaline phosphatase (APISO): 99 U/L (ref 37–153)
BUN: 8 mg/dL (ref 7–25)
CO2: 27 mmol/L (ref 20–32)
Calcium: 11 mg/dL — ABNORMAL HIGH (ref 8.6–10.4)
Chloride: 105 mmol/L (ref 98–110)
Creat: 0.85 mg/dL (ref 0.50–0.99)
Globulin: 2.6 g/dL (calc) (ref 1.9–3.7)
Glucose, Bld: 86 mg/dL (ref 65–99)
Potassium: 4.8 mmol/L (ref 3.5–5.3)
Sodium: 141 mmol/L (ref 135–146)
Total Bilirubin: 0.6 mg/dL (ref 0.2–1.2)
Total Protein: 6.8 g/dL (ref 6.1–8.1)

## 2020-04-24 LAB — HEMOGLOBIN A1C
Hgb A1c MFr Bld: 5.7 % of total Hgb — ABNORMAL HIGH (ref <5.7)
Mean Plasma Glucose: 117 (calc)
eAG (mmol/L): 6.5 (calc)

## 2020-04-24 LAB — LIPID PANEL
Cholesterol: 190 mg/dL (ref <200)
HDL: 58 mg/dL (ref >=50)
LDL Cholesterol (Calc): 105 mg/dL (calc) — ABNORMAL HIGH
Non-HDL Cholesterol (Calc): 132 mg/dL (calc) — ABNORMAL HIGH (ref <130)
Total CHOL/HDL Ratio: 3.3 (calc) (ref <5.0)
Triglycerides: 156 mg/dL — ABNORMAL HIGH (ref <150)

## 2020-04-24 LAB — VITAMIN B12: Vitamin B-12: 511 pg/mL (ref 200–1100)

## 2020-04-24 LAB — VITAMIN D 25 HYDROXY (VIT D DEFICIENCY, FRACTURES): Vit D, 25-Hydroxy: 36 ng/mL (ref 30–100)

## 2020-04-26 ENCOUNTER — Other Ambulatory Visit: Payer: Self-pay | Admitting: Internal Medicine

## 2020-04-26 DIAGNOSIS — Z0000 Encounter for general adult medical examination without abnormal findings: Secondary | ICD-10-CM

## 2020-04-27 ENCOUNTER — Other Ambulatory Visit: Payer: Self-pay | Admitting: Internal Medicine

## 2020-04-27 ENCOUNTER — Encounter: Payer: Self-pay | Admitting: Internal Medicine

## 2020-04-27 DIAGNOSIS — E559 Vitamin D deficiency, unspecified: Secondary | ICD-10-CM

## 2020-04-27 DIAGNOSIS — R7302 Impaired glucose tolerance (oral): Secondary | ICD-10-CM

## 2020-04-27 DIAGNOSIS — E039 Hypothyroidism, unspecified: Secondary | ICD-10-CM

## 2020-04-27 MED ORDER — VITAMIN D (ERGOCALCIFEROL) 1.25 MG (50000 UNIT) PO CAPS: 50000.0000 [IU] | ORAL_CAPSULE | ORAL | 0 refills | Status: DC

## 2020-04-29 ENCOUNTER — Encounter: Payer: Self-pay | Admitting: Nurse Practitioner

## 2020-04-29 NOTE — Telephone Encounter (Signed)
Spoke with patient and reviewed her lab results

## 2020-05-10 NOTE — Addendum Note (Signed)
Addended by: Westley Hummer B on: 05/10/2020 11:01 AM   Modules accepted: Orders

## 2020-05-11 NOTE — Progress Notes (Signed)
05/11/2020 Tonya Cherry [id number] Aug 19, 1950   CHIEF COMPLAINT: Schedule a colonoscopy   HISTORY OF PRESENT ILLNESS: Tonya Cherry is a 69 year old female with a past medical history of anxiety, depression, COPD, hyperlipidemia, hypothyroidism, osteoporosis and GERD.  C section x 1. She presents to our office today as referred by Dr. [name] to schedule a colonoscopy due to having a positive Cologuard test completed on 07/15/2018.  She was previously advised to at schedule colonoscopy following her positive Cologuard test, however, the patient deferred due to the Covid pandemic. She denies ever having a colonoscopy.  She reports her maternal grandmother had colon cancer which required a colostomy and her brother with history of colon polyps.  She reports passing normal formed brown bowel movement 4 days weekly she is her normal bowel pattern.  No rectal bleeding or melena.  She complains of passing a lot of flatulence which is bothersome to her.  If she eats rich food she develops diarrhea. She complains of having reflux for 10 to 15 years. She previously took Prilosec which she discontinued 2018 as she was concerned about long term effects.  She complains of having heartburn with regurgitation of acid or food 3-4 times weekly.  She has epigastric burning pain which occurs primarily when her stomach is empty approximately once weekly for the past 20 years.  No nausea or vomiting.  She is a chronic smoker.  Infrequent NSAID use.  Her weight is stable.  No other complaints today.   CBC Latest Ref Rng & Units 04/23/2020 03/27/2019 08/29/2018  WBC 3.8 - 10.8 Thousand/uL 5.7 6.3 6.0  Hemoglobin 11.7 - 15.5 g/dL 14.8 15.4(H) 15.0  Hematocrit 35 - 45 % 43.4 45.3 43.8  Platelets 140 - 400 Thousand/uL 277 273.0 277.0   CMP Latest Ref Rng & Units 04/23/2020 05/01/2019 03/28/2019  Glucose 65 - 99 mg/dL 86 - -  BUN 7 - 25 mg/dL 8 - -  Creatinine 0.50 - 0.99 mg/dL 0.85 - -  Sodium 135 - 146  mmol/L 141 - -  Potassium 3.5 - 5.3 mmol/L 4.8 - -  Chloride 98 - 110 mmol/L 105 - -  CO2 20 - 32 mmol/L 27 - -  Calcium 8.6 - 10.4 mg/dL 11.0(H) 11.0(H) 10.8(H)  Total Protein 6.1 - 8.1 g/dL 6.8 7.0 -  Total Bilirubin 0.2 - 1.2 mg/dL 0.6 - -  Alkaline Phos 39 - 117 U/L - - -  AST 10 - 35 U/L 17 - -  ALT 6 - 29 U/L 15 - -    Past Medical History:  Diagnosis Date   Anxiety    COPD (chronic obstructive pulmonary disease) ([facility])    Depression    GERD (gastroesophageal reflux disease)    Hyperlipidemia    Osteoporosis    Thyroid disease    Ulcer    Past Surgical History:  Procedure Laterality Date   CESAREAN SECTION     Social History: She is widowed. She smokes cigarettes 1ppd x 50 years. No alcohol or drug use.   Family History: Mother deceased age 58 with history of stroke and heart disease.  Maternal grandmother with history of colon cancer required surgery and a colostomy. Brother with history of colon polyps. Father died age 2 from prostate cancer.    Allergies  Allergen Reactions   Penicillins Cross Reactors Rash   Sulfa Antibiotics Rash   Other Diarrhea    "mycin"       Outpatient Encounter Medications as of  05/12/2020  Medication Sig   ALPRAZolam (XANAX) 0.5 MG tablet TAKE 1 TABLET (0.5 MG TOTAL) BY MOUTH 2 (TWO) TIMES DAILY AS NEEDED.   aspirin EC 81 MG tablet Take 1 tablet (81 mg total) by mouth daily.   atorvastatin (LIPITOR) 20 MG tablet TAKE 1 TABLET BY MOUTH EVERY DAY   cholecalciferol (VITAMIN D3) 25 MCG (1000 UT) tablet Take 5,000 Units by mouth every other day.    levothyroxine (SYNTHROID) 75 MCG tablet TAKE 1 TABLET BY MOUTH EVERY DAY   Vitamin D, Ergocalciferol, (DRISDOL) 1.25 MG (50000 UNIT) CAPS capsule Take 1 capsule (50,000 Units total) by mouth every 7 (seven) days for 12 doses.   No facility-administered encounter medications on file as of 05/12/2020.     REVIEW OF SYSTEMS:  Gen: Denies fever, sweats or chills. No weight  loss.  CV: Denies chest pain, palpitations or edema. Resp: Denies cough, shortness of breath of hemoptysis.  GI: See HPI. GU : Denies urinary burning, blood in urine, increased urinary frequency or incontinence. MS: Denies joint pain, muscles aches or weakness. Derm: Denies rash, itchiness, skin lesions or unhealing ulcers. Psych: Denies depression, anxiety or memory loss. Heme: Denies bruising, bleeding. Neuro:  Denies headaches, dizziness or paresthesias. Endo:  Denies any problems with DM, thyroid or adrenal function.   PHYSICAL EXAM: BP (!) 144/86    Pulse (!) 101    Ht 5\' 4"  (1.626 m)    Wt 155 lb (70.3 kg)    BMI 26.61 kg/m  General: Well developed 69 year old female in no acute distress. Head: Normocephalic and atraumatic. Eyes:  Sclerae non-icteric, conjunctive pink. Ears: Normal auditory acuity. Mouth: Upper dentures. No ulcers or lesions.  Neck: Supple, no lymphadenopathy or thyromegaly.  Lungs: Clear bilaterally to auscultation without wheezes, crackles or rhonchi. Heart: Regular rate and rhythm. No murmur, rub or gallop appreciated.  Abdomen: Soft, nontender, non distended. No masses. No hepatosplenomegaly. Normoactive bowel sounds x 4 quadrants.  Rectal: Deferred. Musculoskeletal: Symmetrical with no gross deformities. Skin: Warm and dry. No rash or lesions on visible extremities. Extremities: No edema. Neurological: Alert oriented x 4, no focal deficits.  Psychological:  Alert and cooperative. Normal mood and affect.  ASSESSMENT AND PLAN:  68.  69 year old female with a positive Cologuard test 06/2018 -Colonoscopy benefits and risks discussed including risk with sedation, risk of bleeding, perforation and infection  -Patient instructed to take MiraLAX nightly for 7 days prior to colonoscopy prep date -Further follow-up to be determined after colonoscopy completed  2.  GERD with acid and food regurgitation. -EGD recommended to rule out GERD and Barrett's esophagus  due to chronic GERD symptoms.  She declined an EGD at this time as she wishes to take Pantoprazole prior to pursuing an EGD. -GERD handout -Pantoprazole 40 mg 1 p.o. daily  3. Constipation.  Increased flatulence. -MiraLAX nightly as tolerated to increase stool output -Phillips bacteria probiotic once daily  4.  Chronic tobacco use -Smoking cessation recommended         CC:  [name], [place]*

## 2020-05-12 ENCOUNTER — Other Ambulatory Visit: Payer: Self-pay | Admitting: Internal Medicine

## 2020-05-12 ENCOUNTER — Encounter: Payer: Self-pay | Admitting: Nurse Practitioner

## 2020-05-12 ENCOUNTER — Ambulatory Visit (INDEPENDENT_AMBULATORY_CARE_PROVIDER_SITE_OTHER): Payer: PPO | Admitting: Nurse Practitioner

## 2020-05-12 VITALS — BP 144/86 | HR 101 | Ht 64.0 in | Wt 155.0 lb

## 2020-05-12 DIAGNOSIS — K219 Gastro-esophageal reflux disease without esophagitis: Secondary | ICD-10-CM | POA: Diagnosis not present

## 2020-05-12 DIAGNOSIS — R195 Other fecal abnormalities: Secondary | ICD-10-CM | POA: Diagnosis not present

## 2020-05-12 DIAGNOSIS — Z800 Family history of malignant neoplasm of digestive organs: Secondary | ICD-10-CM

## 2020-05-12 DIAGNOSIS — R143 Flatulence: Secondary | ICD-10-CM | POA: Diagnosis not present

## 2020-05-12 DIAGNOSIS — F419 Anxiety disorder, unspecified: Secondary | ICD-10-CM

## 2020-05-12 MED ORDER — PANTOPRAZOLE SODIUM 40 MG PO TBEC: 40.0000 mg | DELAYED_RELEASE_TABLET | Freq: Every day | ORAL | 3 refills | Status: DC

## 2020-05-12 MED ORDER — PLENVU 140 G PO SOLR: 1.0000 | Freq: Once | ORAL | 0 refills | Status: AC

## 2020-05-12 NOTE — Progress Notes (Signed)
Agree with assessment and plan as outlined.  

## 2020-05-12 NOTE — Patient Instructions (Signed)
We have sent the following medications to your pharmacy for you to pick up at your convenience:  Pantoprazole.  Begin Miralax daily at bedtime.  Specifically, make sure to take Miralax every night for 7 days prior to the procedure.  Take Hardin Negus Bacteria probiotic daily over the counter for gas.   You have been scheduled for a colonoscopy. Please follow written instructions given to you at your visit today.  Please pick up your prep supplies at the pharmacy within the next 1-3 days. If you use inhalers (even only as needed), please bring them with you on the day of your procedure.

## 2020-05-17 DIAGNOSIS — C44519 Basal cell carcinoma of skin of other part of trunk: Secondary | ICD-10-CM | POA: Diagnosis not present

## 2020-05-19 ENCOUNTER — Other Ambulatory Visit: Payer: Self-pay | Admitting: *Deleted

## 2020-05-19 DIAGNOSIS — F17210 Nicotine dependence, cigarettes, uncomplicated: Secondary | ICD-10-CM

## 2020-05-19 DIAGNOSIS — Z87891 Personal history of nicotine dependence: Secondary | ICD-10-CM

## 2020-06-02 ENCOUNTER — Encounter: Payer: Self-pay | Admitting: Internal Medicine

## 2020-06-02 DIAGNOSIS — M810 Age-related osteoporosis without current pathological fracture: Secondary | ICD-10-CM

## 2020-06-03 ENCOUNTER — Ambulatory Visit: Payer: PPO

## 2020-06-04 NOTE — Telephone Encounter (Signed)
Referral placed.

## 2020-06-11 ENCOUNTER — Telehealth: Payer: Self-pay | Admitting: Internal Medicine

## 2020-06-11 NOTE — Progress Notes (Signed)
  Chronic Care Management   Outreach Note  06/11/2020 Name: Tonya Cherry MRN: [id number] DOB: Feb 19, 1951  Referred by: [name], [name], MD Reason for referral : No chief complaint on file.   An unsuccessful telephone outreach was attempted today. The patient was referred to the pharmacist for assistance with care management and care coordination.   Follow Up Plan:   Carley Perdue [employer] Scheduler

## 2020-06-14 NOTE — Telephone Encounter (Signed)
Patient called back and stated that she was referred to [facility] and per patient she doesn't want to see any of those providers at that office and prefers to go to another location, please advise. CB is [phone]

## 2020-06-17 ENCOUNTER — Ambulatory Visit: Payer: PPO

## 2020-07-04 ENCOUNTER — Encounter: Payer: Self-pay | Admitting: Internal Medicine

## 2020-07-05 ENCOUNTER — Inpatient Hospital Stay: Admission: RE | Admit: 2020-07-05 | Payer: PPO | Source: Ambulatory Visit

## 2020-07-05 ENCOUNTER — Encounter: Payer: PPO | Admitting: Acute Care

## 2020-07-14 ENCOUNTER — Telehealth: Payer: Self-pay | Admitting: Gastroenterology

## 2020-07-14 ENCOUNTER — Other Ambulatory Visit: Payer: Self-pay | Admitting: Internal Medicine

## 2020-07-14 DIAGNOSIS — E559 Vitamin D deficiency, unspecified: Secondary | ICD-10-CM

## 2020-07-14 NOTE — Telephone Encounter (Signed)
Thanks for letting me know, she can reschedule at her convenience.

## 2020-07-14 NOTE — Telephone Encounter (Signed)
Hey Dr Havery Moros, this pt cancelled her colonoscopy scheduled for tomorrow due to her having COVID, pt states she will call back to reschedule.

## 2020-07-15 ENCOUNTER — Encounter: Payer: PPO | Admitting: Gastroenterology

## 2020-07-23 ENCOUNTER — Other Ambulatory Visit: Payer: Self-pay

## 2020-07-23 ENCOUNTER — Ambulatory Visit
Admission: RE | Admit: 2020-07-23 | Discharge: 2020-07-23 | Disposition: A | Discharge status: Home / Self Care | Payer: PPO | Source: Ambulatory Visit | Attending: Internal Medicine | Admitting: Internal Medicine

## 2020-07-23 DIAGNOSIS — Z1231 Encounter for screening mammogram for malignant neoplasm of breast: Secondary | ICD-10-CM | POA: Diagnosis not present

## 2020-07-23 DIAGNOSIS — Z0000 Encounter for general adult medical examination without abnormal findings: Secondary | ICD-10-CM

## 2020-07-27 ENCOUNTER — Telehealth: Payer: Self-pay | Admitting: Internal Medicine

## 2020-07-27 DIAGNOSIS — E559 Vitamin D deficiency, unspecified: Secondary | ICD-10-CM

## 2020-07-27 NOTE — Telephone Encounter (Signed)
Patient has completed her 12 weeks of Vitamin D and wants to know if she needs a follow up scheduled or just labs to see if she needs to keep taking the Vitamin D.  Please advise

## 2020-07-28 NOTE — Telephone Encounter (Signed)
Left detailed message on machine for patient to schedule a lab appointment.  Order placed.

## 2020-07-28 NOTE — Addendum Note (Signed)
Addended by: Westley Hummer B on: 07/28/2020 04:35 PM   Modules accepted: Orders

## 2020-07-30 ENCOUNTER — Other Ambulatory Visit: Payer: Self-pay

## 2020-07-30 ENCOUNTER — Encounter: Payer: Self-pay | Admitting: *Deleted

## 2020-07-30 ENCOUNTER — Other Ambulatory Visit (INDEPENDENT_AMBULATORY_CARE_PROVIDER_SITE_OTHER): Payer: PPO

## 2020-07-30 DIAGNOSIS — E559 Vitamin D deficiency, unspecified: Secondary | ICD-10-CM | POA: Diagnosis not present

## 2020-07-30 LAB — VITAMIN D 25 HYDROXY (VIT D DEFICIENCY, FRACTURES): VITD: 53.59 ng/mL (ref 30.00–100.00)

## 2020-08-02 ENCOUNTER — Telehealth: Payer: Self-pay | Admitting: Internal Medicine

## 2020-08-04 ENCOUNTER — Other Ambulatory Visit: Payer: Self-pay | Admitting: Internal Medicine

## 2020-08-04 DIAGNOSIS — F419 Anxiety disorder, unspecified: Secondary | ICD-10-CM

## 2020-08-04 DIAGNOSIS — E785 Hyperlipidemia, unspecified: Secondary | ICD-10-CM

## 2020-08-20 ENCOUNTER — Telehealth: Payer: Self-pay | Admitting: Internal Medicine

## 2020-08-20 NOTE — Progress Notes (Signed)
  Chronic Care Management   Note  08/20/2020 Name: CALA KRUCKENBERG MRN: [id number] DOB: 12/09/1950  NHUNG DANKO is a 70 y.o. year old female who is a primary care patient of [name], [name], MD. I reached out to Rema Jasmine by phone today in response to a referral sent by Ms. Franco Collet PCP, [name], [name], MD.   Ms. Ferdig was given information about Chronic Care Management services today including:  1. CCM service includes personalized support from designated clinical staff supervised by her physician, including individualized plan of care and coordination with other care providers 2. 24/7 contact phone numbers for assistance for urgent and routine care needs. 3. Service will only be billed when office clinical staff spend 20 minutes or more in a month to coordinate care. 4. Only one practitioner may furnish and bill the service in a calendar month. 5. The patient may stop CCM services at any time (effective at the end of the month) by phone call to the office staff.   Patient agreed to services and verbal consent obtained.   Follow up plan:   Carley Perdue [employer] Scheduler

## 2020-08-20 NOTE — Progress Notes (Signed)
  Chronic Care Management   Note  08/20/2020 Name: Tonya Cherry MRN: [id number] DOB: 12-Dec-1950  Tonya Cherry is a 70 y.o. year old female who is a primary care patient of [name], [name], MD. I reached out to Rema Jasmine by phone today in response to a referral sent by Ms. Franco Collet PCP, [name], [name], MD.   Ms. Scovell was given information about Chronic Care Management services today including:  1. CCM service includes personalized support from designated clinical staff supervised by her physician, including individualized plan of care and coordination with other care providers 2. 24/7 contact phone numbers for assistance for urgent and routine care needs. 3. Service will only be billed when office clinical staff spend 20 minutes or more in a month to coordinate care. 4. Only one practitioner may furnish and bill the service in a calendar month. 5. The patient may stop CCM services at any time (effective at the end of the month) by phone call to the office staff.   Patient agreed to services and verbal consent obtained.   Follow up plan:   Carley Perdue [employer] Scheduler

## 2020-08-24 DIAGNOSIS — E039 Hypothyroidism, unspecified: Secondary | ICD-10-CM | POA: Diagnosis not present

## 2020-08-24 DIAGNOSIS — Z6828 Body mass index (BMI) 28.0-28.9, adult: Secondary | ICD-10-CM | POA: Diagnosis not present

## 2020-08-24 DIAGNOSIS — Z720 Tobacco use: Secondary | ICD-10-CM | POA: Diagnosis not present

## 2020-08-24 DIAGNOSIS — R7303 Prediabetes: Secondary | ICD-10-CM | POA: Diagnosis not present

## 2020-08-24 DIAGNOSIS — M810 Age-related osteoporosis without current pathological fracture: Secondary | ICD-10-CM | POA: Diagnosis not present

## 2020-09-28 DIAGNOSIS — E039 Hypothyroidism, unspecified: Secondary | ICD-10-CM | POA: Diagnosis not present

## 2020-09-28 DIAGNOSIS — R7303 Prediabetes: Secondary | ICD-10-CM | POA: Diagnosis not present

## 2020-10-11 ENCOUNTER — Telehealth: Payer: Self-pay | Admitting: Pharmacist

## 2020-10-11 NOTE — Chronic Care Management (AMB) (Signed)
    Chronic Care Management Pharmacy Assistant   Name: Tonya Cherry  MRN: [id number] DOB: [birth year]    Reason for Encounter: Chart review for CPP visit on 10/14/2020   Conditions to be addressed/monitored: CAD and Hypothyroidsim, Osteoporosis, Vitamind D Deficiency, Dyslipidemia   Recent office visits:  07/14/2020- Telephone message Dr. Jerilee Hoh Acosta(PCP) Started on Vitamin D 50,000 Units Oral Every 7 days  06/02/2020- Patient message- Dr. [name], MD(PCP) Ambulatory referral to Endocrinology placed.   Recent consult visits:  05/17/2020- Office Visit-Sean New Strawn, MD(Dermatologist) Pathology report -Residual Basal cell carcinoma, nodular type margins of excision appear free of tumor in the section examined and incidental intradermal nevus. Mid upper anterior thorax excision.  05/12/2020- Office Visit-Colleen Kennedy-Smith(Gastroenterology) -Patient instructed to take MiraLAX nightly for 7 days prior to colonoscopy prep date, Further follow-up to be determined after colonoscopy completed. GERD with acid and food regurgitation. -EGD recommended to rule out GERD and Barrett's esophagus due to chronic GERD symptoms.  She declined an EGD at this time as she wishes to take Pantoprazole prior to pursuing an EGD. -GERD handout -Pantoprazole 40 mg 1 p.o. daily -Constipation.  Increased flatulence. -MiraLAX nightly as tolerated to increase stool output -Phillips bacteria probiotic once daily -Chronic tobacco use -Smoking cessation recommended -Specialist discontinued Aspirin 81mg  orally daily and Vitamin D 50,000 Units orally every 7 days.  Hospital visits:  None in previous 6 months  Medications: Outpatient Encounter Medications as of 10/11/2020  Medication Sig  . ALPRAZolam (XANAX) 0.5 MG tablet TAKE 1 TABLET BY MOUTH TWICE A DAY AS NEEDED  . atorvastatin (LIPITOR) 20 MG tablet TAKE 1 TABLET BY MOUTH EVERY DAY  . cholecalciferol (VITAMIN D3) 25 MCG (1000 UT) tablet  Take 5,000 Units by mouth every other day.   . levothyroxine (SYNTHROID) 75 MCG tablet TAKE 1 TABLET BY MOUTH EVERY DAY  . pantoprazole (PROTONIX) 40 MG tablet Take 1 tablet (40 mg total) by mouth daily.  Marland Kitchen triamcinolone (KENALOG) 0.1 % Apply 1 application topically 2 (two) times daily.  Marland Kitchen VITAMIN D PO Take by mouth.   No facility-administered encounter medications on file as of 10/11/2020.    Star Rating Drugs: Atorvastatin 20 mg- Last filled 08/04/2020 for 90 day supply at [facility].   SIG: Pattricia Boss, [place] Pharmacist Assistant [phone]

## 2020-10-11 NOTE — Chronic Care Management (AMB) (Deleted)
Error

## 2020-10-12 DIAGNOSIS — R7303 Prediabetes: Secondary | ICD-10-CM | POA: Diagnosis not present

## 2020-10-12 DIAGNOSIS — Z6828 Body mass index (BMI) 28.0-28.9, adult: Secondary | ICD-10-CM | POA: Diagnosis not present

## 2020-10-12 DIAGNOSIS — E039 Hypothyroidism, unspecified: Secondary | ICD-10-CM | POA: Diagnosis not present

## 2020-10-12 DIAGNOSIS — Z720 Tobacco use: Secondary | ICD-10-CM | POA: Diagnosis not present

## 2020-10-12 DIAGNOSIS — M810 Age-related osteoporosis without current pathological fracture: Secondary | ICD-10-CM | POA: Diagnosis not present

## 2020-10-14 ENCOUNTER — Telehealth: Payer: Self-pay | Admitting: *Deleted

## 2020-10-14 ENCOUNTER — Ambulatory Visit (INDEPENDENT_AMBULATORY_CARE_PROVIDER_SITE_OTHER): Payer: PPO | Admitting: Pharmacist

## 2020-10-14 DIAGNOSIS — E039 Hypothyroidism, unspecified: Secondary | ICD-10-CM

## 2020-10-14 DIAGNOSIS — M810 Age-related osteoporosis without current pathological fracture: Secondary | ICD-10-CM | POA: Diagnosis not present

## 2020-10-14 DIAGNOSIS — E559 Vitamin D deficiency, unspecified: Secondary | ICD-10-CM

## 2020-10-14 DIAGNOSIS — E785 Hyperlipidemia, unspecified: Secondary | ICD-10-CM

## 2020-10-14 DIAGNOSIS — R7302 Impaired glucose tolerance (oral): Secondary | ICD-10-CM

## 2020-10-14 NOTE — Telephone Encounter (Signed)
-----   Message from [name], [facility] sent at 10/14/2020 10:29 AM EDT ----- Regarding: CCM referral Hi,  Can you please place a CCM referral for Ms. Pulaski?  Thanks, Maddie

## 2020-10-14 NOTE — Progress Notes (Signed)
Chronic Care Management Pharmacy Note  10/21/2020 Name:  Tonya Cherry MRN:  [id number] DOB:  October 12, 1950  Subjective: Tonya Cherry is an 70 y.o. year old female who is a primary patient of [name], [name], MD.  The CCM team was consulted for assistance with disease management and care coordination needs.    Engaged with patient by telephone for initial visit in response to provider referral for pharmacy case management and/or care coordination services.   Consent to Services:  The patient was given the following information about Chronic Care Management services today, agreed to services, and gave verbal consent: 1. CCM service includes personalized support from designated clinical staff supervised by the primary care provider, including individualized plan of care and coordination with other care providers 2. 24/7 contact phone numbers for assistance for urgent and routine care needs. 3. Service will only be billed when office clinical staff spend 20 minutes or more in a month to coordinate care. 4. Only one practitioner may furnish and bill the service in a calendar month. 5.The patient may stop CCM services at any time (effective at the end of the month) by phone call to the office staff. 6. The patient will be responsible for cost sharing (co-pay) of up to 20% of the service fee (after annual deductible is met). Patient agreed to services and consent obtained.  Patient Care Team: [name], [name], MD as PCP - General (Internal Medicine) [name], [facility] as Pharmacist (Pharmacist)  Recent office visits: 07/14/2020-Telephone message Signa Kell (PCP): Started on [place] D 50,000 Units Oral Every 7 days  06/02/2020- Patient message- Lelon Frohlich, MD(PCP) Ambulatory referral to Endocrinology placed.  04/22/20 [name], MD: Patient presented for annual exam.   Recent consult visits: 09/28/20 [name], Larna Daughters [facility]  medical): Patient presented for preDM, hypothyroidism, and osteoporosis follow up.  08/24/20 [name], Larna Daughters ([facility]): Patient presented for preDM, hypothyroidism, and osteoporosis follow up.   05/12/20 Carl Best (Gastroenterology) -Patient instructed to take MiraLAX nightly for 7 days prior to colonoscopy prep date, Further follow-up to be determined after colonoscopy completed. GERD with acid and food regurgitation. -EGD recommended to rule out GERD and Barrett's esophagus due to chronic GERD symptoms. She declined an EGD at this time as she wishes to take Pantoprazole prior to pursuing an EGD. -GERD handout -Pantoprazole 40 mg 1 p.o. daily  Constipation. Increased flatulence. -MiraLAX nightly as tolerated to increase stool output -Phillips bacteria probiotic once daily  Chronic tobacco use -Smoking cessation recommended Specilist discontinued Aspirin 81mg  orally daily and Vitamin D 50,000 Units oraly every 7 days.  Hospital visits: None in previous 6 months  Objective:  Lab Results  Component Value Date   CREATININE 0.85 04/23/2020   BUN 8 04/23/2020   GFR 67.61 03/27/2019   GFRNONAA >90 03/12/2014   GFRAA >90 03/12/2014   NA 141 04/23/2020   K 4.8 04/23/2020   CALCIUM 11.0 (H) 04/23/2020   CO2 27 04/23/2020   GLUCOSE 86 04/23/2020    Lab Results  Component Value Date/Time   HGBA1C 5.7 (H) 04/23/2020 08:18 AM   HGBA1C 6.0 03/27/2019 08:39 AM   GFR 67.61 03/27/2019 08:39 AM   GFR 70.58 12/05/2018 07:55 AM    Last diabetic Eye exam: No results found for: HMDIABEYEEXA  Last diabetic Foot exam: No results found for: HMDIABFOOTEX   Lab Results  Component Value Date   CHOL 190 04/23/2020   HDL 58 04/23/2020   LDLCALC 105 (H) 04/23/2020  LDLDIRECT 202.4 05/02/2012   TRIG 156 (H) 04/23/2020   CHOLHDL 3.3 04/23/2020    Hepatic Function Latest Ref Rng & Units 04/23/2020 05/01/2019 03/27/2019  Total Protein 6.1 - 8.1 g/dL 6.8 7.0 7.3   Albumin 3.5 - 5.2 g/dL - - 4.6  AST 10 - 35 U/L 17 - 18  ALT 6 - 29 U/L 15 - 17  Alk Phosphatase 39 - 117 U/L - - 97  Total Bilirubin 0.2 - 1.2 mg/dL 0.6 - 0.6  Bilirubin, Direct 0.0 - 0.3 mg/dL - - -    Lab Results  Component Value Date/Time   TSH 1.95 04/23/2020 08:18 AM   TSH 2.13 03/27/2019 08:39 AM    CBC Latest Ref Rng & Units 04/23/2020 03/27/2019 08/29/2018  WBC 3.8 - 10.8 Thousand/uL 5.7 6.3 6.0  Hemoglobin 11.7 - 15.5 g/dL 14.8 15.4(H) 15.0  Hematocrit 35.0 - 45.0 % 43.4 45.3 43.8  Platelets 140 - 400 Thousand/uL 277 273.0 277.0    Lab Results  Component Value Date/Time   VD25OH 53.59 07/30/2020 02:54 PM   VD25OH 36 04/23/2020 08:18 AM   VD25OH 38.59 03/27/2019 08:39 AM    Clinical ASCVD: No  The 10-year ASCVD risk score Mikey Bussing DC Jr., et al., 2013) is: 11.4%   Values used to calculate the score:     Age: 24 years     Sex: Female     Is Non-Hispanic African American: No     Diabetic: No     Tobacco smoker: Yes     Systolic Blood Pressure: 128 mmHg     Is BP treated: No     HDL Cholesterol: 58 mg/dL     Total Cholesterol: 190 mg/dL    Depression screen [facility] 2/9 04/22/2020 03/27/2019 08/29/2018  Decreased Interest 0 0 0  Down, Depressed, Hopeless 3 3 1   PHQ - 2 Score 3 3 1   Altered sleeping 0 0 -  Tired, decreased energy 2 0 -  Change in appetite 0 0 -  Feeling bad or failure about yourself  0 0 -  Trouble concentrating 1 1 -  Moving slowly or fidgety/restless 0 0 -  Suicidal thoughts 0 0 -  PHQ-9 Score 6 4 -  Difficult doing work/chores Not difficult at all Not difficult at all -     Social History   Tobacco Use  Smoking Status Current Every Day Smoker  . Packs/day: 1.00  . Types: Cigarettes  Smokeless Tobacco Never Used   BP Readings from Last 3 Encounters:  05/12/20 (!) 144/86  04/22/20 140/80  06/05/19 120/62   Pulse Readings from Last 3 Encounters:  05/12/20 (!) 101  04/22/20 98  06/05/19 86   Wt Readings from Last 3 Encounters:   05/12/20 155 lb (70.3 kg)  04/22/20 156 lb 9.6 oz (71 kg)  06/05/19 155 lb (70.3 kg)   BMI Readings from Last 3 Encounters:  05/12/20 26.61 kg/m  04/22/20 27.31 kg/m  06/05/19 26.61 kg/m    Assessment/Interventions: Review of patient past medical history, allergies, medications, health status, including review of consultants reports, laboratory and other test data, was performed as part of comprehensive evaluation and provision of chronic care management services.   SDOH:  (Social Determinants of Health) assessments and interventions performed: Yes SDOH Interventions   Flowsheet Row Most Recent Value  SDOH Interventions   Financial Strain Interventions Intervention Not Indicated  Transportation Interventions Intervention Not Indicated     SDOH Screenings   Alcohol Screen: Not on file  Depression (  PHQ2-9): Medium Risk  . PHQ-2 Score: 6  Financial Resource Strain: Low Risk   . Difficulty of Paying Living Expenses: Not hard at all  Food Insecurity: Not on file  Housing: Not on file  Physical Activity: Not on file  Social Connections: Not on file  Stress: Not on file  Tobacco Use: High Risk  . Smoking Tobacco Use: Current Every Day Smoker  . Smokeless Tobacco Use: Never Used  Transportation Needs: No Transportation Needs  . Lack of Transportation (Medical): No  . 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 She currently lives with her son and is sedentary for most of the day and enjoys watching TV.  She shares the cleaning with her son but she does the cooking. Sometimes they eat out or get takeout a couple times a week. She cooks simple foods such as spaghetti hamburger and chicken and admits that they probably eat too much hamburger. She also eats broccoli or corn or potatoes, pinto beans and does not eat vegetables with every meal. She also admits to eating too many desserts and  has something every day including cookies or cake for breakfast with coffee. She has recently cut back on sweet tea and has been drinking more water but does drinks soda every once in a while and coffee every morning.  She is not currently doing any exercise but is going to start walking with the warmer weather. She reports that she sleeps pretty well about 5-6 hours a night and feels rested during the day. She doesn't take naps and doesn't have trouble falling asleep.   Patient doesn't like to take her cholesterol medication but doesn't have a choice. She doesn't have any problems or side effects with her medications.  She reports her doctor wants her to have a normal calcium level in her urine and wants her to start having normal amounts of calcium in her diet as she was staying away from it before.   CCM Care Plan  Allergies  Allergen Reactions  . Penicillins Cross Reactors Rash  . Sulfa Antibiotics Rash  . Other Diarrhea    "mycin"     Medications Reviewed Today    Reviewed by Verner Chol, [facility] (Pharmacist) on 10/14/20 at 1455  Med List Status: <None>  Medication Order Taking? Sig Documenting Provider Last Dose Status Informant  ALPRAZolam (XANAX) 0.5 MG tablet [id number] Yes TAKE 1 TABLET BY MOUTH TWICE A DAY AS NEEDED Philip Aspen, Limmie Patricia, MD Taking Active   atorvastatin (LIPITOR) 20 MG tablet [id number] Yes TAKE 1 TABLET BY MOUTH EVERY DAY Philip Aspen, Limmie Patricia, MD Taking Active   cholecalciferol (VITAMIN D3) 25 MCG (1000 UT) tablet [id number] Yes Take 5,000 Units by mouth 3 (three) times a week. [provider] Taking Active   levothyroxine (SYNTHROID) 75 MCG tablet [id number] Yes TAKE 1 TABLET BY MOUTH EVERY DAY Philip Aspen, Limmie Patricia, MD Taking Active   triamcinolone (KENALOG) 0.1 % [id number] Yes Apply 1 application topically 2 (two) times daily. [provider] Taking Active           Patient Active Problem List   Diagnosis Date Noted  .  Hypercalcemia 09/03/2018  . Vitamin D deficiency 05/31/2018  . Osteoporosis 05/30/2018  . Carotid artery stenosis 05/20/2012  . Dyslipidemia 05/09/2012  . Impaired glucose tolerance 05/09/2012  . Tobacco abuse 05/09/2012  . Hypothyroidism 01/05/2012  Immunization History  Administered Date(s) Administered  . PFIZER(Purple Top)SARS-COV-2 Vaccination 09/04/2019, 10/02/2019, 05/06/2020  . Pneumococcal Conjugate-13 04/03/2017  . Pneumococcal Polysaccharide-23 05/30/2018    Conditions to be addressed/monitored:  Hyperlipidemia, GERD, Hypothyroidism, Anxiety, Osteoporosis, Tobacco use and Prediabetes and vitamin D deficiency  Care Plan : CCM Pharmacy Care Plan  Updates made by [name], [place] since 10/21/2020 12:00 AM    Problem: Problem: Hyperlipidemia, GERD, Hypothyroidism, Anxiety, Osteoporosis, Tobacco use and Prediabetes and vitamin D deficiency     Long-Range Goal: Patient-Specific Goal   Start Date: 10/14/2020  Expected End Date: 10/14/2021  This Visit's Progress: On track  Priority: High  Note:   Current Barriers:  . Unable to independently monitor therapeutic efficacy . Unable to achieve control of cholesterol   Pharmacist Clinical Goal(s):  Marland Kitchen Patient will achieve adherence to monitoring guidelines and medication adherence to achieve therapeutic efficacy through collaboration with PharmD and provider.   Interventions: . 1:1 collaboration with [name], [name], MD regarding development and update of comprehensive plan of care as evidenced by provider attestation and co-signature . Inter-disciplinary care team collaboration (see longitudinal plan of care) . Comprehensive medication review performed; medication list updated in electronic medical record  Hyperlipidemia: (LDL goal < 100) -Uncontrolled -Current treatment: . Atorvastatin 20 mg 1 tablet daily -Medications previously tried: none  -Current dietary patterns: fries food often; recommended  limiting corn oil and using olive or canola oil -Current exercise habits: not routinely -Educated on Cholesterol goals;  Benefits of statin for ASCVD risk reduction; Importance of limiting foods high in cholesterol; Exercise goal of 150 minutes per week; -Counseled on diet and exercise extensively Recommended to continue current medication  Pre-diabetes (A1c goal <6.5%) -Controlled -Current medications: . No medications -Medications previously tried: none  -Current home glucose readings . fasting glucose: does not need to check . post prandial glucose: does not need to check -Denies hypoglycemic/hyperglycemic symptoms -Current meal patterns:  . breakfast: cake/cookies and coffee  . Lunch/dinner: hamburger or chicken with a side; spaghetti . snacks: sweets . drinks: sweet tea -Current exercise: not routinely -Educated on A1c and blood sugar goals; Exercise goal of 150 minutes per week; Carbohydrate counting and/or plate method -Counseled to check feet daily and get yearly eye exams -Counseled on diet and exercise extensively Mailed healthy plate handout  Depression/Anxiety (Goal: minimize symptoms and stabilize mood) -Not ideally controlled -Current treatment: . Alprazolam 0.5 mg 1 tablet twice daily as needed -Medications previously tried/failed: Paroxetine -PHQ9: 6 -GAD7: n/a -Educated on Benefits of medication for symptom control Benefits of cognitive-behavioral therapy with or without medication -Counseled on long term risks of taking alprazolam such as fractures, falls and memory loss Scheduled appt with PCP to consider trying Paxil again for mood and smoking cessation  Tobacco use (Goal quit smoking) -Uncontrolled -Previous quit attempts: pregnant with third child (cold [place]), Paxil -Current treatment  . No medications -Patient smokes Within 30 minutes of waking -Patient triggers include: stress and anxiety -On a scale of 1-10, reports MOTIVATION to quit is  5 -On a scale of 1-10, reports CONFIDENCE in quitting is 5 -Provided contact information for [place] Quit Line (1-800-QUIT-NOW) and encouraged patient to reach out to this group for support. - Set up PCP appointment to discuss trying Paxil again for quitting smoking  Osteoporosis (Goal prevent fractures) -Uncontrolled -Last DEXA Scan: 05/26/2019  T-Score femoral neck: -2.9, -2.8  T-Score total hip: n/a  T-Score lumbar spine: -2.2  T-Score forearm radius: n/a  10-year probability of major  osteoporotic fracture: n/a  10-year probability of hip fracture: n/a -Patient is a candidate for pharmacologic treatment due to T-Score < -2.5 in femoral neck -Current treatment  . Vitamin D 5000 units three times a week . Calcium in her diet -Medications previously tried: none  -Recommend [phone] units of vitamin D daily. Recommend 1200 mg of calcium daily from dietary and supplemental sources. Recommend weight-bearing and muscle strengthening exercises for building and maintaining bone density. -Recommended discussing with PCP about starting a medication to strengthen her bones  Vitamin D deficiency (Goal: 30-100) -Controlled -Current treatment  . Vitamin D 5000 units three times a week -Medications previously tried: none  -Recommended to continue current medication  Hypothyroidism (Goal: TSH 0.35-4.5) -Controlled -Current treatment  . Levothyroxine 75 mcg 1 tablet daily -Medications previously tried: none -Recommended to continue current medication  GERD (Goal: minimize symptoms) -Controlled -Current treatment  . No medications -Medications previously tried: pantoprazole  -Counseled on non-pharmacologic management of symptoms such as elevating the head of your bed, avoiding eating 2-3 hours before bed, avoiding triggering foods such as acidic, spicy, or fatty foods, eating smaller meals, and wearing clothes that are loose around the waist   Health Maintenance -Vaccine gaps: shingles,  tetanus -Current therapy:  . Triamcinolone 0.1% apply twice daily . Vitamin B12 500 mcg daily -Educated on Cost vs benefit of each product must be carefully weighed by individual consumer -Patient is satisfied with current therapy and denies issues -Recommended to continue current medication  Patient Goals/Self-Care Activities . Patient will:  - take medications as prescribed target a minimum of 150 minutes of moderate intensity exercise weekly engage in dietary modifications by increasing fiber intake and limiting fried foods  Follow Up Plan: Telephone follow up appointment with care management team member scheduled for:  6 months     Medication Assistance: None required.  Patient affirms current coverage meets needs.  Patient's preferred pharmacy is:  [id number] - [place], [place] - [address] [place] [address] Phone: [phone] Fax: [phone]  Uses pill box? No - not many medicines Pt endorses 95% compliance - once a month missing  We discussed: Current pharmacy is preferred with insurance plan and patient is satisfied with pharmacy services Patient decided to: Continue current medication management strategy  Care Plan and Follow Up Patient Decision:  Patient agrees to Care Plan and Follow-up.  Plan: Telephone follow up appointment with care management team member scheduled for:  6 months  Jeni Salles, PharmD [place] Pharmacist [place] [facility] [place] [phone]

## 2020-10-21 NOTE — Patient Instructions (Addendum)
Hi Tonya Cherry,  It was great to get to meet you over the telephone! Below is a summary of some of the topics we discussed. As we discussed, keep working on making some of those diet changes and adding exercise to your routine to help lower your cholesterol.   Please reach out to me if you have any questions or need anything before our follow up!  Best, Maddie  Jeni Salles, PharmD, [facility]  Visit Information  Goals Addressed   None    Patient Care Plan: CCM Pharmacy Care Plan    Problem Identified: Problem: Hyperlipidemia, GERD, Hypothyroidism, Anxiety, Osteoporosis, Tobacco use and Prediabetes and vitamin D deficiency     Long-Range Goal: Patient-Specific Goal   Start Date: 10/14/2020  Expected End Date: 10/14/2021  This Visit's Progress: On track  Priority: High  Note:   Current Barriers:  . Unable to independently monitor therapeutic efficacy . Unable to achieve control of cholesterol   Pharmacist Clinical Goal(s):  Marland Kitchen Patient will achieve adherence to monitoring guidelines and medication adherence to achieve therapeutic efficacy through collaboration with PharmD and provider.   Interventions: . 1:1 collaboration with [name], [name], MD regarding development and update of comprehensive plan of care as evidenced by provider attestation and co-signature . Inter-disciplinary care team collaboration (see longitudinal plan of care) . Comprehensive medication review performed; medication list updated in electronic medical record  Hyperlipidemia: (LDL goal < 100) -Uncontrolled -Current treatment: . Atorvastatin 20 mg 1 tablet daily -Medications previously tried: none  -Current dietary patterns: fries food often; recommended limiting corn oil and using olive or canola oil -Current exercise habits: not routinely -Educated on Cholesterol goals;  Benefits of statin for ASCVD risk reduction; Importance  of limiting foods high in cholesterol; Exercise goal of 150 minutes per week; -Counseled on diet and exercise extensively Recommended to continue current medication  Pre-diabetes (A1c goal <6.5%) -Controlled -Current medications: . No medications -Medications previously tried: none  -Current home glucose readings . fasting glucose: does not need to check . post prandial glucose: does not need to check -Denies hypoglycemic/hyperglycemic symptoms -Current meal patterns:  . breakfast: cake/cookies and coffee  . Lunch/dinner: hamburger or chicken with a side; spaghetti . snacks: sweets . drinks: sweet tea -Current exercise: not routinely -Educated on A1c and blood sugar goals; Exercise goal of 150 minutes per week; Carbohydrate counting and/or plate method -Counseled to check feet daily and get yearly eye exams -Counseled on diet and exercise extensively Mailed healthy plate handout  Depression/Anxiety (Goal: minimize symptoms and stabilize mood) -Not ideally controlled -Current treatment: . Alprazolam 0.5 mg 1 tablet twice daily as needed -Medications previously tried/failed: Paroxetine -PHQ9: 6 -GAD7: n/a -Educated on Benefits of medication for symptom control Benefits of cognitive-behavioral therapy with or without medication -Counseled on long term risks of taking alprazolam such as fractures, falls and memory loss Scheduled appt with PCP to consider trying Paxil again for mood and smoking cessation  Tobacco use (Goal quit smoking) -Uncontrolled -Previous quit attempts: pregnant with third child (cold [place]), Paxil -Current treatment  . No medications -Patient smokes Within 30 minutes of waking -Patient triggers include: stress and anxiety -On a scale of 1-10, reports MOTIVATION to quit is 5 -On a scale of 1-10, reports CONFIDENCE in quitting is 5 -Provided contact information for [place] [facility]uit Line (1-800-QUIT-NOW) and encouraged patient to reach out to this group for  support. - Set up PCP appointment to discuss trying Paxil again for quitting  smoking  Osteoporosis (Goal prevent fractures) -Uncontrolled -Last DEXA Scan: 05/26/2019  T-Score femoral neck: -2.9, -2.8  T-Score total hip: n/a  T-Score lumbar spine: -2.2  T-Score forearm radius: n/a  10-year probability of major osteoporotic fracture: n/a  10-year probability of hip fracture: n/a -Patient is a candidate for pharmacologic treatment due to T-Score < -2.5 in femoral neck -Current treatment  . Vitamin D 5000 units three times a week . Calcium in her diet -Medications previously tried: none  -Recommend [phone] units of vitamin D daily. Recommend 1200 mg of calcium daily from dietary and supplemental sources. Recommend weight-bearing and muscle strengthening exercises for building and maintaining bone density. -Recommended discussing with PCP about starting a medication to strengthen her bones  Vitamin D deficiency (Goal: 30-100) -Controlled -Current treatment  . Vitamin D 5000 units three times a week -Medications previously tried: none  -Recommended to continue current medication  Hypothyroidism (Goal: TSH 0.35-4.5) -Controlled -Current treatment  . Levothyroxine 75 mcg 1 tablet daily -Medications previously tried: none -Recommended to continue current medication  GERD (Goal: minimize symptoms) -Controlled -Current treatment  . No medications -Medications previously tried: pantoprazole  -Counseled on non-pharmacologic management of symptoms such as elevating the head of your bed, avoiding eating 2-3 hours before bed, avoiding triggering foods such as acidic, spicy, or fatty foods, eating smaller meals, and wearing clothes that are loose around the waist   Health Maintenance -Vaccine gaps: shingles, tetanus -Current therapy:  . Triamcinolone 0.1% apply twice daily . Vitamin B12 500 mcg daily -Educated on Cost vs benefit of each product must be carefully weighed by individual  consumer -Patient is satisfied with current therapy and denies issues -Recommended to continue current medication  Patient Goals/Self-Care Activities . Patient will:  - take medications as prescribed target a minimum of 150 minutes of moderate intensity exercise weekly engage in dietary modifications by increasing fiber intake and limiting fried foods  Follow Up Plan: Telephone follow up appointment with care management team member scheduled for:  6 months     Ms. Mahady was given information about Chronic Care Management services today including:  1. CCM service includes personalized support from designated clinical staff supervised by her physician, including individualized plan of care and coordination with other care providers 2. 24/7 contact phone numbers for assistance for urgent and routine care needs. 3. Standard insurance, coinsurance, copays and deductibles apply for chronic care management only during months in which we provide at least 20 minutes of these services. Most insurances cover these services at 100%, however patients may be responsible for any copay, coinsurance and/or deductible if applicable. This service may help you avoid the need for more expensive face-to-face services. 4. Only one practitioner may furnish and bill the service in a calendar month. 5. The patient may stop CCM services at any time (effective at the end of the month) by phone call to the office staff.  Patient agreed to services and verbal consent obtained.   The patient verbalized understanding of instructions, educational materials, and care plan provided today and agreed to receive a mailed copy of patient instructions, educational materials, and care plan.  Telephone follow up appointment with pharmacy team member scheduled for:  [name], [facility]  High Cholesterol  High cholesterol is a condition in which the blood has high levels of a white, waxy substance similar to fat (cholesterol).  The liver makes all the cholesterol that the body needs. The human body needs small amounts of cholesterol to help build  cells. A person gets extra or excess cholesterol from the food that he or she eats. The blood carries cholesterol from the liver to the rest of the body. If you have high cholesterol, deposits (plaques) may build up on the walls of your arteries. Arteries are the blood vessels that carry blood away from your heart. These plaques make the arteries narrow and stiff. Cholesterol plaques increase your risk for heart attack and stroke. Work with your health care provider to keep your cholesterol levels in a healthy range. What increases the risk? The following factors may make you more likely to develop this condition:  Eating foods that are high in animal fat (saturated fat) or cholesterol.  Being overweight.  Not getting enough exercise.  A family history of high cholesterol (familial hypercholesterolemia).  Use of tobacco products.  Having diabetes. What are the signs or symptoms? There are no symptoms of this condition. How is this diagnosed? This condition may be diagnosed based on the results of a blood test.  If you are older than 69 years of age, your health care provider may check your cholesterol levels every 4-6 years.  You may be checked more often if you have high cholesterol or other risk factors for heart disease. The blood test for cholesterol measures:  "Bad" cholesterol, or LDL cholesterol. This is the main type of cholesterol that causes heart disease. The desired level is less than 100 mg/dL.  "Good" cholesterol, or HDL cholesterol. HDL helps protect against heart disease by cleaning the arteries and carrying the LDL to the liver for processing. The desired level for HDL is 60 mg/dL or higher.  Triglycerides. These are fats that your body can store or burn for energy. The desired level is less than 150 mg/dL.  Total cholesterol. This measures the  total amount of cholesterol in your blood and includes LDL, HDL, and triglycerides. The desired level is less than 200 mg/dL. How is this treated? This condition may be treated with:  Diet changes. You may be asked to eat foods that have more fiber and less saturated fats or added sugar.  Lifestyle changes. These may include regular exercise, maintaining a healthy weight, and quitting use of tobacco products.  Medicines. These are given when diet and lifestyle changes have not worked. You may be prescribed a statin medicine to help lower your cholesterol levels. Follow these instructions at home: Eating and drinking  Eat a healthy, balanced diet. This diet includes: ? Daily servings of a variety of fresh, frozen, or canned fruits and vegetables. ? Daily servings of whole grain foods that are rich in fiber. ? Foods that are low in saturated fats and trans fats. These include poultry and fish without skin, lean cuts of meat, and low-fat dairy products. ? A variety of fish, especially oily fish that contain omega-3 fatty acids. Aim to eat fish at least 2 times a week.  Avoid foods and drinks that have added sugar.  Use healthy cooking methods, such as roasting, grilling, broiling, baking, poaching, steaming, and stir-frying. Do not fry your food except for stir-frying.   Lifestyle  Get regular exercise. Aim to exercise for a total of 150 minutes a week. Increase your activity level by doing activities such as gardening, walking, and taking the stairs.  Do not use any products that contain nicotine or tobacco, such as cigarettes, e-cigarettes, and chewing tobacco. If you need help quitting, ask your health care provider.   General instructions  Take over-the-counter and prescription  medicines only as told by your health care provider.  Keep all follow-up visits as told by your health care provider. This is important. Where to find more information  American Heart Association:  www.heart.org  National Heart, Lung, and Blood Institute: https://wilson-eaton.com/ Contact a health care provider if:  You have trouble achieving or maintaining a healthy diet or weight.  You are starting an exercise program.  You are unable to stop smoking. Get help right away if:  You have chest pain.  You have trouble breathing.  You have any symptoms of a stroke. "BE FAST" is an easy way to remember the main warning signs of a stroke: ? B - Balance. Signs are dizziness, sudden trouble walking, or loss of balance. ? E - Eyes. Signs are trouble seeing or a sudden change in vision. ? F - Face. Signs are sudden weakness or numbness of the face, or the face or eyelid drooping on one side. ? A - Arms. Signs are weakness or numbness in an arm. This happens suddenly and usually on one side of the body. ? S - Speech. Signs are sudden trouble speaking, slurred speech, or trouble understanding what people say. ? T - Time. Time to call emergency services. Write down what time symptoms started.  You have other signs of a stroke, such as: ? A sudden, severe headache with no known cause. ? Nausea or vomiting. ? Seizure. These symptoms may represent a serious problem that is an emergency. Do not wait to see if the symptoms will go away. Get medical help right away. Call your local emergency services (911 in the [place] Do not drive yourself to the hospital. Summary  Cholesterol plaques increase your risk for heart attack and stroke. Work with your health care provider to keep your cholesterol levels in a healthy range.  Eat a healthy, balanced diet, get regular exercise, and maintain a healthy weight.  Do not use any products that contain nicotine or tobacco, such as cigarettes, e-cigarettes, and chewing tobacco.  Get help right away if you have any symptoms of a stroke. This information is not intended to replace advice given to you by your health care provider. Make sure you discuss any questions  you have with your health care provider. Document Revised: 05/12/2019 Document Reviewed: 05/12/2019 Elsevier Patient Education  2021 [employer].

## 2020-10-27 ENCOUNTER — Other Ambulatory Visit: Payer: Self-pay

## 2020-10-28 ENCOUNTER — Encounter: Payer: Self-pay | Admitting: Internal Medicine

## 2020-10-28 ENCOUNTER — Ambulatory Visit (INDEPENDENT_AMBULATORY_CARE_PROVIDER_SITE_OTHER): Payer: PPO | Admitting: Internal Medicine

## 2020-10-28 VITALS — BP 120/70 | HR 88 | Temp 98.2°F | Wt 155.3 lb

## 2020-10-28 DIAGNOSIS — E785 Hyperlipidemia, unspecified: Secondary | ICD-10-CM | POA: Diagnosis not present

## 2020-10-28 DIAGNOSIS — F320 Major depressive disorder, single episode, mild: Secondary | ICD-10-CM

## 2020-10-28 DIAGNOSIS — E039 Hypothyroidism, unspecified: Secondary | ICD-10-CM

## 2020-10-28 DIAGNOSIS — F419 Anxiety disorder, unspecified: Secondary | ICD-10-CM | POA: Diagnosis not present

## 2020-10-28 DIAGNOSIS — Z720 Tobacco use: Secondary | ICD-10-CM | POA: Diagnosis not present

## 2020-10-28 DIAGNOSIS — R195 Other fecal abnormalities: Secondary | ICD-10-CM

## 2020-10-28 DIAGNOSIS — Z122 Encounter for screening for malignant neoplasm of respiratory organs: Secondary | ICD-10-CM

## 2020-10-28 DIAGNOSIS — R21 Rash and other nonspecific skin eruption: Secondary | ICD-10-CM | POA: Diagnosis not present

## 2020-10-28 MED ORDER — ALPRAZOLAM 0.5 MG PO TABS: 0.5000 mg | ORAL_TABLET | Freq: Two times a day (BID) | ORAL | 2 refills | Status: DC | PRN

## 2020-10-28 MED ORDER — TRIAMCINOLONE ACETONIDE 0.1 % EX CREA
1.0000 | TOPICAL_CREAM | Freq: Two times a day (BID) | CUTANEOUS | 1 refills | Status: DC
Start: 2020-10-28 — End: 2022-03-07

## 2020-10-28 NOTE — Patient Instructions (Signed)
-  Nice seeing you today!!  -Make sure to get your 4th COVID vaccine.  -Please schedule your colonoscopy with GI.  -Please schedule therapy sessions.  -Schedule follow up in 3 months.

## 2020-10-28 NOTE — Progress Notes (Signed)
Established Patient Office Visit     This visit occurred during the SARS-CoV-2 public health emergency.  Safety protocols were in place, including screening questions prior to the visit, additional usage of staff PPE, and extensive cleaning of exam room while observing appropriate contact time as indicated for disinfecting solutions.    CC/Reason for Visit: Medication refills, discuss some acute concerns  HPI: Tonya Cherry is a 70 y.o. female who is coming in today for the above mentioned reasons.  She has been noticing increased fatigue and depression lately.  She has no drive to do things, she has been very fatigued.  She has hypothyroidism but recently had her TSH checked by her endocrinologist and it was normal.  Of note she had a positive Cologuard in 2020, referral for GI was made, she delayed due to the pandemic.  She was referred again after her physical last October.  She then canceled again the day of the procedure.  Past Medical/Surgical History: Past Medical History:  Diagnosis Date  . Anxiety   . COPD (chronic obstructive pulmonary disease) ([place])   . Depression   . GERD (gastroesophageal reflux disease)   . Hyperlipidemia   . Osteoporosis   . Thyroid disease   . Ulcer     Past Surgical History:  Procedure Laterality Date  . CESAREAN SECTION      Social History:  reports that she has been smoking cigarettes. She has been smoking about 1.00 pack per day. She has never used smokeless tobacco. She reports that she does not drink alcohol and does not use drugs.  Allergies: Allergies  Allergen Reactions  . Penicillins Cross Reactors Rash  . Sulfa Antibiotics Rash  . Other Diarrhea    "mycin"     Family History:  Family History  Problem Relation Age of Onset  . Heart disease Mother   . Stroke Mother   . Alcohol abuse Father   . Prostate cancer Father   . Colon cancer Maternal Grandmother   . Colon polyps Brother   . Breast cancer Neg Hx   .  Esophageal cancer Neg Hx   . Rectal cancer Neg Hx   . Stomach cancer Neg Hx   . Hypercalcemia Neg Hx   . Pancreatic cancer Neg Hx      Current Outpatient Medications:  .  atorvastatin (LIPITOR) 20 MG tablet, TAKE 1 TABLET BY MOUTH EVERY DAY, Disp: 90 tablet, Rfl: 1 .  cholecalciferol (VITAMIN D3) 25 MCG (1000 UT) tablet, Take 5,000 Units by mouth 3 (three) times a week., Disp: , Rfl:  .  levothyroxine (SYNTHROID) 75 MCG tablet, TAKE 1 TABLET BY MOUTH EVERY DAY, Disp: 90 tablet, Rfl: 1 .  ALPRAZolam (XANAX) 0.5 MG tablet, Take 1 tablet (0.5 mg total) by mouth 2 (two) times daily as needed., Disp: 60 tablet, Rfl: 2 .  triamcinolone cream (KENALOG) 0.1 %, Apply 1 application topically 2 (two) times daily., Disp: 30 g, Rfl: 1  Review of Systems:  Constitutional: Denies fever, chills, diaphoresis, appetite change. HEENT: Denies photophobia, eye pain, redness, hearing loss, ear pain, congestion, sore throat, rhinorrhea, sneezing, mouth sores, trouble swallowing, neck pain, neck stiffness and tinnitus.   Respiratory: Denies SOB, DOE, cough, chest tightness,  and wheezing.   Cardiovascular: Denies chest pain, palpitations and leg swelling.  Gastrointestinal: Denies nausea, vomiting, abdominal pain, diarrhea, constipation, blood in stool and abdominal distention.  Genitourinary: Denies dysuria, urgency, frequency, hematuria, flank pain and difficulty urinating.  Endocrine: Denies: hot or  cold intolerance, sweats, changes in hair or nails, polyuria, polydipsia. Musculoskeletal: Denies myalgias, back pain, joint swelling, arthralgias and gait problem.  Skin: Denies pallor, rash and wound.  Neurological: Denies dizziness, seizures, syncope, weakness, light-headedness, numbness and headaches.  Hematological: Denies adenopathy. Easy bruising, personal or family bleeding history  Psychiatric/Behavioral: Denies suicidal ideation, mood changes, confusion, nervousness, sleep disturbance and  agitation    Physical Exam: Vitals:   10/28/20 1556  BP: 120/70  Pulse: 88  Temp: 98.2 F (36.8 C)  TempSrc: Oral  SpO2: 98%  Weight: 155 lb 4.8 oz (70.4 kg)    Body mass index is 26.66 kg/m.   Constitutional: NAD, calm, comfortable Eyes: PERRL, lids and conjunctivae normal ENMT: Mucous membranes are moist.  Respiratory: clear to auscultation bilaterally, no wheezing, no crackles. Normal respiratory effort. No accessory muscle use.  Cardiovascular: Regular rate and rhythm, no murmurs / rubs / gallops. No extremity edema.  Neurologic: Grossly intact and nonfocal Psychiatric: Normal judgment and insight. Alert and oriented x 3. Normal mood.    Impression and Plan:  Positive colorectal cancer screening using Cologuard test -Have strongly encouraged her to follow-up with GI for her colonoscopy that she has now canceled twice.  Anxiety/fatigue -Probably also has some mild depression. -She has been given information on CBT sessions, if not better at next visit will consider medication management. -She is requesting refills of Xanax today.  Acquired hypothyroidism -Managed by endocrinology, last TSH was within normal limits.  Tobacco abuse -I have discussed tobacco cessation with the patient.  I have counseled the patient regarding the negative impacts of continued tobacco use including but not limited to lung cancer, COPD, and cardiovascular disease.  I have discussed alternatives to tobacco and modalities that may help facilitate tobacco cessation including but not limited to biofeedback, hypnosis, and medications.  Total time spent with tobacco counseling was 4 minutes. -She remains in the precontemplative stage. -I will refer her for lung cancer screening.  Depression, major, single episode, mild ([place]) -She has scored a 5 on PHQ-9 today. -Have advised that she schedule CBT sessions. -If no better at next visit in 3 months or so we can consider medication.  Rash -She  has a chronic foot rash, would like me to refill her triamcinolone cream that is usually prescribed by her podiatrist.   Patient Instructions  -Nice seeing you today!!  -Make sure to get your 4th COVID vaccine.  -Please schedule your colonoscopy with GI.  -Please schedule therapy sessions.  -Schedule follow up in 3 months.     Lelon Frohlich, MD [facility]

## 2020-10-31 ENCOUNTER — Other Ambulatory Visit: Payer: Self-pay | Admitting: Internal Medicine

## 2021-01-11 ENCOUNTER — Telehealth: Payer: Self-pay | Admitting: Pharmacist

## 2021-01-11 NOTE — Chronic Care Management (AMB) (Signed)
    Chronic Care Management Pharmacy Assistant   Name: Tonya Cherry  MRN: [id number] DOB: [birth year]  Reason for Encounter: Disease State   Conditions to be addressed/monitored: HLD  Recent office visits:  05.05.2022  [name], [name], MD patient was present for a follow up visit for positive Cologuard test. Referral placed for lung cancer screening.  Recent consult visits:  None  Hospital visits:  None in previous 6 months  Medications: Outpatient Encounter Medications as of 01/11/2021  Medication Sig   ALPRAZolam (XANAX) 0.5 MG tablet Take 1 tablet (0.5 mg total) by mouth 2 (two) times daily as needed.   atorvastatin (LIPITOR) 20 MG tablet TAKE 1 TABLET BY MOUTH EVERY DAY   cholecalciferol (VITAMIN D3) 25 MCG (1000 UT) tablet Take 5,000 Units by mouth 3 (three) times a week.   levothyroxine (SYNTHROID) 75 MCG tablet TAKE 1 TABLET BY MOUTH EVERY DAY   triamcinolone cream (KENALOG) 0.1 % Apply 1 application topically 2 (two) times daily.   No facility-administered encounter medications on file as of 01/11/2021.   01/11/2021 Name: Tonya Cherry MRN: [id number] DOB: [birth year] Rema Jasmine is a 70 y.o. year old female who is a primary care patient of [name], [name], MD.  Comprehensive medication review performed; Spoke to patient regarding cholesterol  Lipid Panel    Component Value Date/Time   CHOL 190 04/23/2020 0818   TRIG 156 (H) 04/23/2020 0818   HDL 58 04/23/2020 0818   LDLCALC 105 (H) 04/23/2020 0818   LDLDIRECT 202.4 05/02/2012 0823    10-year ASCVD risk score: The 10-year ASCVD risk score Mikey Bussing DC Jr., et al., 2013) is: 11.1%   Values used to calculate the score:     Age: 70 years     Sex: Female     Is Non-Hispanic African American: No     Diabetic: No     Tobacco smoker: Yes     Systolic Blood Pressure: 573 mmHg     Is BP treated: No     HDL Cholesterol: 58 mg/dL     Total Cholesterol: 190 mg/dL  Current antihyperlipidemic  regimen:  Atorvastatin 20 mg daily Previous antihyperlipidemic medications tried:  ASCVD risk enhancing conditions: current smoker What recent interventions/DTPs have been made by any provider to improve Cholesterol control since last CPP Visit:None Any recent hospitalizations or ED visits since last visit with CPP? No What diet changes have been made to improve Cholesterol?   What exercise is being done to improve Cholesterol?    Adherence Review: Does the patient have >5 day gap between last estimated fill dates? No  I attempted on several occasions to contact patient for HDL adherence call. Patient has not returned calls  Care Gaps: Zoster Vaccine Covid Booster -4  Star Rating Drugs: Medication Dispensed  Quantity Pharmacy  Atorvastatin 20 mg 05.08.2022 [facility]   Amilia ([place]) Mare Ferrari, [place] Pharmacist Assistant [phone]

## 2021-01-13 DIAGNOSIS — M810 Age-related osteoporosis without current pathological fracture: Secondary | ICD-10-CM | POA: Diagnosis not present

## 2021-01-20 DIAGNOSIS — Z6828 Body mass index (BMI) 28.0-28.9, adult: Secondary | ICD-10-CM | POA: Diagnosis not present

## 2021-01-20 DIAGNOSIS — M810 Age-related osteoporosis without current pathological fracture: Secondary | ICD-10-CM | POA: Diagnosis not present

## 2021-01-20 DIAGNOSIS — E559 Vitamin D deficiency, unspecified: Secondary | ICD-10-CM | POA: Diagnosis not present

## 2021-01-20 DIAGNOSIS — Z720 Tobacco use: Secondary | ICD-10-CM | POA: Diagnosis not present

## 2021-01-20 DIAGNOSIS — R7303 Prediabetes: Secondary | ICD-10-CM | POA: Diagnosis not present

## 2021-01-20 DIAGNOSIS — E039 Hypothyroidism, unspecified: Secondary | ICD-10-CM | POA: Diagnosis not present

## 2021-01-27 ENCOUNTER — Other Ambulatory Visit: Payer: Self-pay | Admitting: Internal Medicine

## 2021-01-27 DIAGNOSIS — F419 Anxiety disorder, unspecified: Secondary | ICD-10-CM

## 2021-01-27 DIAGNOSIS — E785 Hyperlipidemia, unspecified: Secondary | ICD-10-CM

## 2021-02-18 ENCOUNTER — Telehealth: Payer: Self-pay | Admitting: Pharmacist

## 2021-02-18 NOTE — Chronic Care Management (AMB) (Signed)
    Chronic Care Management Pharmacy Assistant   Name: Tonya Cherry  MRN: [id number] DOB: 06-May-1951  Reason for Encounter: Disease State/ Hyperlipidemia Assessment Call.    Conditions to be addressed/monitored: HLD   Recent office visits:  10/28/20 [name] MD (PCP) - seen for positive colorectal cancer screening and other issues. Referral placed for lung cancer. No medication changes. Follow up in 3 months.   Recent consult visits:  None.   Hospital visits:  None in previous 6 months  Medications: Outpatient Encounter Medications as of 02/18/2021  Medication Sig   ALPRAZolam (XANAX) 0.5 MG tablet TAKE 1 TABLET BY MOUTH 2 TIMES DAILY AS NEEDED.   atorvastatin (LIPITOR) 20 MG tablet TAKE 1 TABLET BY MOUTH EVERY DAY   cholecalciferol (VITAMIN D3) 25 MCG (1000 UT) tablet Take 5,000 Units by mouth 3 (three) times a week.   levothyroxine (SYNTHROID) 75 MCG tablet TAKE 1 TABLET BY MOUTH EVERY DAY   triamcinolone cream (KENALOG) 0.1 % Apply 1 application topically 2 (two) times daily.   No facility-administered encounter medications on file as of 02/18/2021.   Fill History: ALPRAZOLAM 0.5 MG TABLET 01/27/2021 30   ATORVASTATIN 20 MG TABLET 01/27/2021 90   LEVOTHYROXINE 75 MCG TABLET 12/02/2020 90   Comprehensive medication review performed; Spoke to patient regarding cholesterol  Lipid Panel    Component Value Date/Time   CHOL 190 04/23/2020 0818   TRIG 156 (H) 04/23/2020 0818   HDL 58 04/23/2020 0818   LDLCALC 105 (H) 04/23/2020 0818   LDLDIRECT 202.4 05/02/2012 0823    10-year ASCVD risk score: The 10-year ASCVD risk score Mikey Bussing DC Brooke Bonito., et al., 2013) is: 12.5%   Values used to calculate the score:     Age: 70 years     Sex: Female     Is Non-Hispanic African American: No     Diabetic: No     Tobacco smoker: Yes     Systolic Blood Pressure: 0000000 mmHg     Is BP treated: No     HDL Cholesterol: 58 mg/dL     Total Cholesterol: 190 mg/dL  Current  antihyperlipidemic regimen:  Atorvastatin '20mg'$  - take 1 tablet by mouth everyday.  Previous antihyperlipidemic medications tried: None. ASCVD risk enhancing conditions: age >44 What recent interventions/DTPs have been made by any provider to improve Cholesterol control since last CPP Visit: None. Any recent hospitalizations or ED visits since last visit with CPP? No What diet changes have been made to improve Cholesterol?   What exercise is being done to improve Cholesterol?    Adherence Review: Does the patient have >5 day gap between last estimated fill dates? No  Multiple unsuccessful attempts to reach patient by phone.  Care Gaps:  AWV - message sent to Ramond Craver CMA to schedule.  Zoster vaccines - never done Covid-19 vaccine booster 4 - overdue since 08/06/20 Flu vaccine - due  Star Rating Drugs:  Atorvastatin '20mg'$  - last filled on 01/27/21 90DS   [place]  Clinical Pharmacist Assistant [phone]

## 2021-02-21 NOTE — Telephone Encounter (Cosign Needed)
2nd attempt

## 2021-03-16 ENCOUNTER — Telehealth: Payer: Self-pay | Admitting: Pharmacist

## 2021-03-16 NOTE — Chronic Care Management (AMB) (Signed)
     Chronic Care Management Pharmacy Assistant   Name: Tonya Cherry  MRN: [id number] DOB: Dec 22, 1950   Reason for Encounter: Disease State/ Hyperlipidemia Assessment Call.   Conditions to be addressed/monitored: HLD   Recent office visits:  10/28/20 [name] MD (PCP) - seen for positive colorectal cancer screening and other issues. Referral placed for lung cancer. No medication changes. Follow up in 3 months.     Recent consult visits:  None.  Hospital visits:  None in previous 6 months  Medications: Outpatient Encounter Medications as of 03/16/2021  Medication Sig   ALPRAZolam (XANAX) 0.5 MG tablet TAKE 1 TABLET BY MOUTH 2 TIMES DAILY AS NEEDED.   atorvastatin (LIPITOR) 20 MG tablet TAKE 1 TABLET BY MOUTH EVERY DAY   cholecalciferol (VITAMIN D3) 25 MCG (1000 UT) tablet Take 5,000 Units by mouth 3 (three) times a week.   levothyroxine (SYNTHROID) 75 MCG tablet TAKE 1 TABLET BY MOUTH EVERY DAY   triamcinolone cream (KENALOG) 0.1 % Apply 1 application topically 2 (two) times daily.   No facility-administered encounter medications on file as of 03/16/2021.   Fill History: ALPRAZOLAM 0.5 MG TABLET 02/24/2021 30   ATORVASTATIN 20 MG TABLET 01/27/2021 90   VITAMIN D 50000UNIT CAPSULE 07/15/2020 84   LEVOTHYROXINE 75 MCG TABLET 12/02/2020 90   TRIAMCINOLONE 0.1% CREAM 10/28/2020 30   Comprehensive medication review performed; Spoke to patient regarding cholesterol  Lipid Panel    Component Value Date/Time   CHOL 190 04/23/2020 0818   TRIG 156 (H) 04/23/2020 0818   HDL 58 04/23/2020 0818   LDLCALC 105 (H) 04/23/2020 0818   LDLDIRECT 202.4 05/02/2012 0823    10-year ASCVD risk score: The 10-year ASCVD risk score (Arnett DK, et al., 2019) is: 13.6%   Values used to calculate the score:     Age: 4 years     Sex: Female     Is Non-Hispanic African American: No     Diabetic: No     Tobacco smoker: Yes     Systolic Blood Pressure: 798 mmHg     Is BP treated:  No     HDL Cholesterol: 58 mg/dL     Total Cholesterol: 190 mg/dL  Current antihyperlipidemic regimen:  Atorvastatin 20mg  - take 1 tablet by mouth everyday. Previous antihyperlipidemic medications tried: None. ASCVD risk enhancing conditions: age >27 What recent interventions/DTPs have been made by any provider to improve Cholesterol control since last CPP Visit: None. Any recent hospitalizations or ED visits since last visit with CPP? No What diet changes have been made to improve Cholesterol?   What exercise is being done to improve Cholesterol?    Adherence Review: Does the patient have >5 day gap between last estimated fill dates? No  Multiple unsuccessful attempts to reach patient by phone. Care Gaps:  AWV - message sent to Ramond Craver CMA to schedule.  Zoster vaccines - never done Covid-19 vaccine booster 4 - overdue since 08/06/20 Flu vaccine - due  Star Rating Drugs:  Atorvastatin 20mg  - last filled on 01/27/21 90DS   [place]  Clinical Pharmacist Assistant [phone]

## 2021-03-17 NOTE — Telephone Encounter (Cosign Needed)
2nd attempt

## 2021-03-22 NOTE — Telephone Encounter (Cosign Needed)
3rd attempt

## 2021-04-13 ENCOUNTER — Telehealth: Payer: PPO

## 2021-04-22 ENCOUNTER — Other Ambulatory Visit: Payer: Self-pay | Admitting: Internal Medicine

## 2021-04-22 DIAGNOSIS — F419 Anxiety disorder, unspecified: Secondary | ICD-10-CM

## 2021-04-25 NOTE — Telephone Encounter (Signed)
Patient called to follow up on prescription for ALPRAZolam (XANAX) 0.5 MG tablet . Patient states she is out of medication. Patient wants to know if another provider could fill it today. I let patient know that Dr.Hernandez would be in office tomorrow and I would send message back    Please send to  [id number] - [place], [place] - [address] Phone:  [phone]  Fax:  [phone]

## 2021-05-05 ENCOUNTER — Encounter: Payer: PPO | Admitting: Internal Medicine

## 2021-05-23 ENCOUNTER — Other Ambulatory Visit: Payer: Self-pay | Admitting: Internal Medicine

## 2021-05-23 DIAGNOSIS — F419 Anxiety disorder, unspecified: Secondary | ICD-10-CM

## 2021-06-01 ENCOUNTER — Telehealth: Payer: Self-pay

## 2021-06-01 DIAGNOSIS — E039 Hypothyroidism, unspecified: Secondary | ICD-10-CM

## 2021-06-01 DIAGNOSIS — Z0000 Encounter for general adult medical examination without abnormal findings: Secondary | ICD-10-CM

## 2021-06-01 DIAGNOSIS — R5383 Other fatigue: Secondary | ICD-10-CM

## 2021-06-01 DIAGNOSIS — M810 Age-related osteoporosis without current pathological fracture: Secondary | ICD-10-CM

## 2021-06-01 DIAGNOSIS — R7302 Impaired glucose tolerance (oral): Secondary | ICD-10-CM

## 2021-06-01 DIAGNOSIS — E785 Hyperlipidemia, unspecified: Secondary | ICD-10-CM

## 2021-06-01 DIAGNOSIS — E538 Deficiency of other specified B group vitamins: Secondary | ICD-10-CM

## 2021-06-01 DIAGNOSIS — E559 Vitamin D deficiency, unspecified: Secondary | ICD-10-CM

## 2021-06-01 NOTE — Telephone Encounter (Signed)
Patient called asking if she can have labs drawn before physical on 12/9 @ 1p.  I informed patient a message would be sent and Cma would give her a call if ok'd by Dr. Jerilee Hoh

## 2021-06-02 NOTE — Addendum Note (Signed)
Addended by: Westley Hummer B on: 06/02/2021 11:39 AM   Modules accepted: Orders

## 2021-06-02 NOTE — Telephone Encounter (Signed)
Lab orders placed and lab appointment scheduled.

## 2021-06-03 ENCOUNTER — Encounter: Payer: Self-pay | Admitting: Internal Medicine

## 2021-06-03 ENCOUNTER — Ambulatory Visit (INDEPENDENT_AMBULATORY_CARE_PROVIDER_SITE_OTHER): Payer: PPO | Admitting: Internal Medicine

## 2021-06-03 ENCOUNTER — Other Ambulatory Visit (INDEPENDENT_AMBULATORY_CARE_PROVIDER_SITE_OTHER): Payer: PPO

## 2021-06-03 VITALS — BP 120/80 | HR 85 | Temp 98.0°F | Ht 62.75 in | Wt 153.6 lb

## 2021-06-03 DIAGNOSIS — E538 Deficiency of other specified B group vitamins: Secondary | ICD-10-CM

## 2021-06-03 DIAGNOSIS — M810 Age-related osteoporosis without current pathological fracture: Secondary | ICD-10-CM

## 2021-06-03 DIAGNOSIS — E039 Hypothyroidism, unspecified: Secondary | ICD-10-CM

## 2021-06-03 DIAGNOSIS — Z0000 Encounter for general adult medical examination without abnormal findings: Secondary | ICD-10-CM | POA: Diagnosis not present

## 2021-06-03 DIAGNOSIS — F17210 Nicotine dependence, cigarettes, uncomplicated: Secondary | ICD-10-CM

## 2021-06-03 DIAGNOSIS — R7302 Impaired glucose tolerance (oral): Secondary | ICD-10-CM | POA: Diagnosis not present

## 2021-06-03 DIAGNOSIS — E785 Hyperlipidemia, unspecified: Secondary | ICD-10-CM

## 2021-06-03 DIAGNOSIS — R5383 Other fatigue: Secondary | ICD-10-CM | POA: Diagnosis not present

## 2021-06-03 DIAGNOSIS — E559 Vitamin D deficiency, unspecified: Secondary | ICD-10-CM | POA: Diagnosis not present

## 2021-06-03 DIAGNOSIS — F419 Anxiety disorder, unspecified: Secondary | ICD-10-CM | POA: Diagnosis not present

## 2021-06-03 DIAGNOSIS — Z124 Encounter for screening for malignant neoplasm of cervix: Secondary | ICD-10-CM | POA: Diagnosis not present

## 2021-06-03 LAB — CBC WITH DIFFERENTIAL/PLATELET
Basophils Absolute: 0 10*3/uL (ref 0.0–0.1)
Basophils Relative: 0.9 % (ref 0.0–3.0)
Eosinophils Absolute: 0.2 10*3/uL (ref 0.0–0.7)
Eosinophils Relative: 3.2 % (ref 0.0–5.0)
HCT: 42.1 % (ref 36.0–46.0)
Hemoglobin: 14.2 g/dL (ref 12.0–15.0)
Lymphocytes Relative: 30.9 % (ref 12.0–46.0)
Lymphs Abs: 1.8 10*3/uL (ref 0.7–4.0)
MCHC: 33.8 g/dL (ref 30.0–36.0)
MCV: 94.7 fl (ref 78.0–100.0)
Monocytes Absolute: 0.5 10*3/uL (ref 0.1–1.0)
Monocytes Relative: 7.9 % (ref 3.0–12.0)
Neutro Abs: 3.3 10*3/uL (ref 1.4–7.7)
Neutrophils Relative %: 57.1 % (ref 43.0–77.0)
Platelets: 248 10*3/uL (ref 150.0–400.0)
RBC: 4.45 Mil/uL (ref 3.87–5.11)
RDW: 13 % (ref 11.5–15.5)
WBC: 5.7 10*3/uL (ref 4.0–10.5)

## 2021-06-03 LAB — LIPID PANEL
Cholesterol: 179 mg/dL (ref 0–200)
HDL: 59.7 mg/dL (ref >39.00)
LDL Cholesterol: 100 mg/dL — ABNORMAL HIGH (ref 0–99)
NonHDL: 119.55
Total CHOL/HDL Ratio: 3
Triglycerides: 99 mg/dL (ref 0.0–149.0)
VLDL: 19.8 mg/dL (ref 0.0–40.0)

## 2021-06-03 LAB — COMPREHENSIVE METABOLIC PANEL
ALT: 14 U/L (ref 0–35)
AST: 19 U/L (ref 0–37)
Albumin: 4.2 g/dL (ref 3.5–5.2)
Alkaline Phosphatase: 79 U/L (ref 39–117)
BUN: 14 mg/dL (ref 6–23)
CO2: 26 mEq/L (ref 19–32)
Calcium: 10.6 mg/dL — ABNORMAL HIGH (ref 8.4–10.5)
Chloride: 107 mEq/L (ref 96–112)
Creatinine, Ser: 0.78 mg/dL (ref 0.40–1.20)
GFR: 77.59 mL/min (ref >60.00)
Glucose, Bld: 81 mg/dL (ref 70–99)
Potassium: 3.9 mEq/L (ref 3.5–5.1)
Sodium: 140 mEq/L (ref 135–145)
Total Bilirubin: 0.5 mg/dL (ref 0.2–1.2)
Total Protein: 6.9 g/dL (ref 6.0–8.3)

## 2021-06-03 LAB — TSH: TSH: 2.22 u[IU]/mL (ref 0.35–5.50)

## 2021-06-03 LAB — HEMOGLOBIN A1C: Hgb A1c MFr Bld: 6.1 % (ref 4.6–6.5)

## 2021-06-03 LAB — VITAMIN D 25 HYDROXY (VIT D DEFICIENCY, FRACTURES): VITD: 27.94 ng/mL — ABNORMAL LOW (ref 30.00–100.00)

## 2021-06-03 LAB — VITAMIN B12: Vitamin B-12: 251 pg/mL (ref 211–911)

## 2021-06-03 MED ORDER — ALPRAZOLAM 0.5 MG PO TABS: 0.5000 mg | ORAL_TABLET | Freq: Two times a day (BID) | ORAL | 1 refills | Status: DC | PRN

## 2021-06-03 NOTE — Progress Notes (Signed)
Established Patient Office Visit     This visit occurred during the SARS-CoV-2 public health emergency.  Safety protocols were in place, including screening questions prior to the visit, additional usage of staff PPE, and extensive cleaning of exam room while observing appropriate contact time as indicated for disinfecting solutions.    CC/Reason for Visit: Annual preventive exam and subsequent Medicare wellness visit  HPI: MEEAH TOTINO is a 70 y.o. female who is coming in today for the above mentioned reasons. Past Medical History is significant for: Hypothyroidism, vitamin D deficiency, hyperlipidemia, osteoporosis.  She had carotid artery stenosis that had resolved on a subsequent carotid Doppler.  She had a positive Cologuard over a year ago, and despite multiple requests and referrals to GI by myself has yet to follow-up with colonoscopy.  She had a mammogram of 2022, she is overdue for a DEXA scan and a Pap smear.  She is overdue for flu, Tdap, COVID booster and shingles vaccines.  She is a smoker of about a pack a day for years.   Past Medical/Surgical History: Past Medical History:  Diagnosis Date   Anxiety    COPD (chronic obstructive pulmonary disease) ([place])    Depression    GERD (gastroesophageal reflux disease)    Hyperlipidemia    Osteoporosis    Thyroid disease    Ulcer     Past Surgical History:  Procedure Laterality Date   CESAREAN SECTION      Social History:  reports that she has been smoking cigarettes. She has been smoking an average of 1 pack per day. She has never used smokeless tobacco. She reports that she does not drink alcohol and does not use drugs.  Allergies: Allergies  Allergen Reactions   Penicillins Cross Reactors Rash   Sulfa Antibiotics Rash   Other Diarrhea    "mycin"     Family History:  Family History  Problem Relation Age of Onset   Heart disease Mother    Stroke Mother    Alcohol abuse Father    Prostate cancer Father     Colon cancer Maternal Grandmother    Colon polyps Brother    Breast cancer Neg Hx    Esophageal cancer Neg Hx    Rectal cancer Neg Hx    Stomach cancer Neg Hx    Hypercalcemia Neg Hx    Pancreatic cancer Neg Hx      Current Outpatient Medications:    atorvastatin (LIPITOR) 20 MG tablet, TAKE 1 TABLET BY MOUTH EVERY DAY, Disp: 90 tablet, Rfl: 1   cholecalciferol (VITAMIN D3) 25 MCG (1000 UT) tablet, Take 5,000 Units by mouth 3 (three) times a week., Disp: , Rfl:    levothyroxine (SYNTHROID) 75 MCG tablet, TAKE 1 TABLET BY MOUTH EVERY DAY, Disp: 90 tablet, Rfl: 1   triamcinolone cream (KENALOG) 0.1 %, Apply 1 application topically 2 (two) times daily., Disp: 30 g, Rfl: 1   ALPRAZolam (XANAX) 0.5 MG tablet, Take 1 tablet (0.5 mg total) by mouth 2 (two) times daily as needed., Disp: 180 tablet, Rfl: 1  Review of Systems:  Constitutional: Denies fever, chills, diaphoresis, appetite change and fatigue.  HEENT: Denies photophobia, eye pain, redness, hearing loss, ear pain, congestion, sore throat, rhinorrhea, sneezing, mouth sores, trouble swallowing, neck pain, neck stiffness and tinnitus.   Respiratory: Denies SOB, DOE, cough, chest tightness,  and wheezing.   Cardiovascular: Denies chest pain, palpitations and leg swelling.  Gastrointestinal: Denies nausea, vomiting, abdominal pain, diarrhea, constipation, blood  in stool and abdominal distention.  Genitourinary: Denies dysuria, urgency, frequency, hematuria, flank pain and difficulty urinating.  Endocrine: Denies: hot or cold intolerance, sweats, changes in hair or nails, polyuria, polydipsia. Musculoskeletal: Denies myalgias, back pain, joint swelling, arthralgias and gait problem.  Skin: Denies pallor, rash and wound.  Neurological: Denies dizziness, seizures, syncope, weakness, light-headedness, numbness and headaches.  Hematological: Denies adenopathy. Easy bruising, personal or family bleeding history  Psychiatric/Behavioral:  Denies suicidal ideation, mood changes, confusion, nervousness, sleep disturbance and agitation    Physical Exam: Vitals:   06/03/21 1308  BP: 120/80  Pulse: 85  Temp: 98 F (36.7 C)  TempSrc: Oral  SpO2: 98%  Weight: 153 lb 9.6 oz (69.7 kg)  Height: 5' 2.75" (1.594 m)    Body mass index is 27.43 kg/m.   Constitutional: NAD, calm, comfortable Eyes: PERRL, lids and conjunctivae normal ENMT: Mucous membranes are moist. Posterior pharynx clear of any exudate or lesions.  Has dentures tympanic membrane is pearly white, no erythema or bulging. Neck: normal, supple, no masses, no thyromegaly Respiratory: clear to auscultation bilaterally, no wheezing, no crackles. Normal respiratory effort. No accessory muscle use.  Cardiovascular: Regular rate and rhythm, no murmurs / rubs / gallops. No extremity edema. 2+ pedal pulses. No carotid bruits.  Abdomen: no tenderness, no masses palpated. No hepatosplenomegaly. Bowel sounds positive.  Musculoskeletal: no clubbing / cyanosis. No joint deformity upper and lower extremities. Good ROM, no contractures. Normal muscle tone.  Skin: no rashes, lesions, ulcers. No induration Neurologic: CN 2-12 grossly intact. Sensation intact, DTR normal. Strength 5/5 in all 4.  Psychiatric: Normal judgment and insight. Alert and oriented x 3. Normal mood.    Subsequent Medicare wellness visit   1. Risk factors, based on past  M,S,F -cardiovascular disease risk factors include age, history of hyperlipidemia and ongoing nicotine dependence   2.  Physical activities: Just activities of daily living, is otherwise quite sedentary   3.  Depression/mood: Stable   4.  Hearing: Chronic hearing difficulty of left ear   5.  ADL's: Independent in all ADLs   6.  Fall risk: Low fall risk   7.  Home safety: No problems identified   8.  Height weight, and visual acuity: height and weight as above, vision:  Vision Screening   Right eye Left eye Both eyes  Without  correction 20/32 20/25 20/20   With correction        9.  Counseling: Advised she update her vaccination status, advise she follow-up with GI for colonoscopy given positive Cologuard.   10. Lab orders based on risk factors: Laboratory update will be reviewed   11. Referral : For DEXA scan, she is advised to call GIs office to reschedule her colonoscopy   12. Care plan: Follow-up with me in 6 months   13. Cognitive assessment: No cognitive impairment   14. Screening: Patient provided with a written and personalized 5-10 year screening schedule in the AVS. yes   15. Provider List Update: PCP only  16. Advance Directives: Full code   17. Opioids: Patient is not on any opioid prescriptions and has no risk factors for a substance use disorder.   [place] Office Visit from 06/03/2021 in [place] at [facility]  PHQ-9 Total Score 13       Fall Risk 05/30/2018 08/29/2018 03/27/2019 04/22/2020 06/03/2021  Falls in the past year? 0 0 0 0 0  Was there an injury with Fall? 0 0 0 0 0  Fall Risk Category  Calculator 0 0 0 0 0  Fall Risk Category Low Low Low Low Low      Impression and Plan:  Encounter for preventive health examination -Recommend routine eye and dental care. -Immunizations: She is due for flu, COVID booster, shingles, tetanus, declines all today despite counseling -Healthy lifestyle discussed in detail. -Labs to be updated today. -Colon cancer screening: She had a positive Cologuard in 2020 and has not yet had her colonoscopy despite multiple referrals -Breast cancer screening: January/2022 -Cervical cancer screening: Overdue, refer to GYN -Lung cancer screening: Referral for low-dose CT scan -Prostate cancer screening: Not applicable -DEXA: DEXA ordered today.  Anxiety  - Plan: ALPRAZolam (XANAX) 0.5 MG tablet  Cigarette nicotine dependence without complication -I have discussed tobacco cessation with the patient.  I have counseled the patient  regarding the negative impacts of continued tobacco use including but not limited to lung cancer, COPD, and cardiovascular disease.  I have discussed alternatives to tobacco and modalities that may help facilitate tobacco cessation including but not limited to biofeedback, hypnosis, and medications.  Total time spent with tobacco counseling was 3 minutes. -She is not interested in committing to quitting smoking at this time.   Acquired hypothyroidism -Check TSH, continue daily levothyroxine supplementation.  Impaired glucose tolerance -Check A1c.  Dyslipidemia -Check lipids, she is on atorvastatin daily.  Osteoporosis without current pathological fracture, unspecified osteoporosis type  - Plan: DG Bone Density  Vitamin D deficiency -Check levels today.    Patient Instructions  -Nice seeing you today!!  -Lab work today; will notify you once results are available.  -Remember your COVID booster, flu, shingles and tdap vaccines at the pharmacy.  -Remember to call GI to schedule your colonoscopy.  -Schedule follow up in 6 months.   Lelon Frohlich, MD [facility]

## 2021-06-03 NOTE — Patient Instructions (Signed)
-  Nice seeing you today!!  -Lab work today; will notify you once results are available.  -Remember your COVID booster, flu, shingles and tdap vaccines at the pharmacy.  -Remember to call GI to schedule your colonoscopy.  -Schedule follow up in 6 months.

## 2021-06-08 ENCOUNTER — Other Ambulatory Visit: Payer: Self-pay | Admitting: Internal Medicine

## 2021-06-08 DIAGNOSIS — E559 Vitamin D deficiency, unspecified: Secondary | ICD-10-CM

## 2021-06-08 MED ORDER — VITAMIN D (ERGOCALCIFEROL) 1.25 MG (50000 UNIT) PO CAPS
50000.0000 [IU] | ORAL_CAPSULE | ORAL | 0 refills | Status: AC
Start: 2021-06-08 — End: 2021-08-25

## 2021-06-20 ENCOUNTER — Other Ambulatory Visit: Payer: Self-pay | Admitting: Internal Medicine

## 2021-06-23 ENCOUNTER — Other Ambulatory Visit: Payer: Self-pay | Admitting: Internal Medicine

## 2021-06-23 DIAGNOSIS — F419 Anxiety disorder, unspecified: Secondary | ICD-10-CM

## 2021-06-23 NOTE — Telephone Encounter (Signed)
-   last filled 05/25/21 - last office visit 06/03/21

## 2021-06-30 ENCOUNTER — Telehealth: Payer: Self-pay | Admitting: Pharmacist

## 2021-06-30 NOTE — Chronic Care Management (AMB) (Signed)
Chronic Care Management Pharmacy Assistant   Name: Tonya Cherry  MRN: [id number] DOB: 03/15/1951  Reason for Encounter: Disease State / Hypertension Assessment Call   Conditions to be addressed/monitored: HTN  Recent office visits:  06/03/2021 Lelon Frohlich MD - Patient was seen for preventative health exam. No medication changes. Follow up in 6 months.  Recent consult visits:  None  Hospital visits:  None  Medications: Outpatient Encounter Medications as of 06/30/2021  Medication Sig   ALPRAZolam (XANAX) 0.5 MG tablet TAKE 1 TABLET BY MOUTH TWICE A DAY AS NEEDED   atorvastatin (LIPITOR) 20 MG tablet TAKE 1 TABLET BY MOUTH EVERY DAY   cholecalciferol (VITAMIN D3) 25 MCG (1000 UT) tablet Take 5,000 Units by mouth 3 (three) times a week.   levothyroxine (SYNTHROID) 75 MCG tablet TAKE 1 TABLET BY MOUTH EVERY DAY   triamcinolone cream (KENALOG) 0.1 % Apply 1 application topically 2 (two) times daily.   Vitamin D, Ergocalciferol, (DRISDOL) 1.25 MG (50000 UNIT) CAPS capsule Take 1 capsule (50,000 Units total) by mouth every 7 (seven) days for 12 doses.   No facility-administered encounter medications on file as of 06/30/2021.  Fill History: ALPRAZOLAM 0.5 MG TABLET 06/23/2021 30   ATORVASTATIN 20 MG TABLET 01/27/2021 90   VITAMIN D2 1.25MG (50,000 UNIT) 06/08/2021 84   LEVOTHYROXINE 75 MCG TABLET 06/21/2021 90   Reviewed chart prior to disease state call. Spoke with patient regarding BP  Recent Office Vitals: BP Readings from Last 3 Encounters:  06/03/21 120/80  10/28/20 120/70  05/12/20 (!) 144/86   Pulse Readings from Last 3 Encounters:  06/03/21 85  10/28/20 88  05/12/20 (!) 101    Wt Readings from Last 3 Encounters:  06/03/21 153 lb 9.6 oz (69.7 kg)  10/28/20 155 lb 4.8 oz (70.4 kg)  05/12/20 155 lb (70.3 kg)     Kidney Function Lab Results  Component Value Date/Time   CREATININE 0.78 06/03/2021 07:21 AM   CREATININE 0.85 04/23/2020 08:18 AM    CREATININE 0.84 03/27/2019 08:39 AM   GFR 77.59 06/03/2021 07:21 AM   GFRNONAA >90 03/12/2014 01:00 PM   GFRAA >90 03/12/2014 01:00 PM    BMP Latest Ref Rng & Units 06/03/2021 04/23/2020 05/01/2019  Glucose 70 - 99 mg/dL 81 86 -  BUN 6 - 23 mg/dL 14 8 -  Creatinine 0.40 - 1.20 mg/dL 0.78 0.85 -  BUN/Creat Ratio 6 - 22 (calc) - NOT APPLICABLE -  Sodium 409 - 145 mEq/L 140 141 -  Potassium 3.5 - 5.1 mEq/L 3.9 4.8 -  Chloride 96 - 112 mEq/L 107 105 -  CO2 19 - 32 mEq/L 26 27 -  Calcium 8.4 - 10.5 mg/dL 10.6(H) 11.0(H) 11.0(H)    Current antihypertensive regimen:  No hypertension medications.  How often are you checking your Blood Pressure?   Current home BP readings:   What recent interventions/DTPs have been made by any provider to improve Blood Pressure control since last CPP Visit: No recent interventions.  Any recent hospitalizations or ED visits since last visit with CPP? No recent hospital visits.  What diet changes have been made to improve Blood Pressure Control?    What exercise is being done to improve your Blood Pressure Control?    Adherence Review: Is the patient currently on ACE/ARB medication? No Does the patient have >5 day gap between last estimated fill dates? No  Care Gaps: AWV - message sent to Ramond Craver CMA to schedule.  Last BP -  120/80 on 06/03/2021 Covid vaccine - overdue Colonoscopy - overdue  Star Rating Drugs: Atorvastatin 20 mg - [facility] with [place] Pharmacist Assistant [phone]

## 2021-07-14 DIAGNOSIS — E559 Vitamin D deficiency, unspecified: Secondary | ICD-10-CM | POA: Diagnosis not present

## 2021-07-14 DIAGNOSIS — E039 Hypothyroidism, unspecified: Secondary | ICD-10-CM | POA: Diagnosis not present

## 2021-07-14 LAB — BASIC METABOLIC PANEL
BUN: 8 (ref 4–21)
CO2: 30 — AB (ref 13–22)
Chloride: 105 (ref 99–108)
Creatinine: 0.9 (ref 0.5–1.1)
Glucose: 86
Potassium: 4.7 (ref 3.4–5.3)
Sodium: 143 (ref 137–147)

## 2021-07-14 LAB — COMPREHENSIVE METABOLIC PANEL
Albumin: 3.8 (ref 3.5–5.0)
Calcium: 10.3 (ref 8.7–10.7)
GFR calc Af Amer: 79
Globulin: 2.9

## 2021-07-14 LAB — HEPATIC FUNCTION PANEL
ALT: 19 (ref 7–35)
AST: 17 (ref 13–35)

## 2021-07-14 LAB — VITAMIN D 25 HYDROXY (VIT D DEFICIENCY, FRACTURES): Vit D, 25-Hydroxy: 44.8

## 2021-07-14 LAB — TSH: TSH: 1.32 (ref 0.41–5.90)

## 2021-07-21 ENCOUNTER — Other Ambulatory Visit: Payer: Self-pay | Admitting: Internal Medicine

## 2021-07-21 DIAGNOSIS — M810 Age-related osteoporosis without current pathological fracture: Secondary | ICD-10-CM | POA: Diagnosis not present

## 2021-07-21 DIAGNOSIS — Z6828 Body mass index (BMI) 28.0-28.9, adult: Secondary | ICD-10-CM | POA: Diagnosis not present

## 2021-07-21 DIAGNOSIS — F419 Anxiety disorder, unspecified: Secondary | ICD-10-CM

## 2021-07-21 DIAGNOSIS — E039 Hypothyroidism, unspecified: Secondary | ICD-10-CM | POA: Diagnosis not present

## 2021-07-21 DIAGNOSIS — R7303 Prediabetes: Secondary | ICD-10-CM | POA: Diagnosis not present

## 2021-07-21 DIAGNOSIS — Z720 Tobacco use: Secondary | ICD-10-CM | POA: Diagnosis not present

## 2021-07-22 ENCOUNTER — Encounter: Payer: Self-pay | Admitting: Internal Medicine

## 2021-07-22 NOTE — Telephone Encounter (Signed)
Patient called wanting the prescription for ALPRAZolam (XANAX) 0.5 MG tablet to be sent over. Informed patient that refill request has been sent to the doctor and will be reviewed once she is back in office. Patient asked for me to send prescription over because she is out. Informed patient that the refill request has been sent over to the doctor but will have to wait for her to be back in office for it to be reviewed.   Patient then stated that she needs [place] to send over medication because she is out and it does not need review from Dr. Jerilee Hoh because it was just approved in December and asked if I saw. Told patient that I see her last refill was in December for 60 tablets but that the most recent rx request has been sent and is waiting for Dr. Jerilee Hoh to review and that she will be back in office Monday.  Patient became upset and stated that the prescription has already been approved and needs to be sent by me immediately. Informed patient that I am front desk staff, and that once Dr. Jerilee Hoh is back in office, the refill request will be reviewed.   Patient became more upset and started to yell. Told patient that she will have to wait until Dr. Jerilee Hoh and her team is back in office.   Patient yelled that "she will talk to Dr. Jerilee Hoh herself on Monday" and disconnected call.  Please advise.

## 2021-07-23 NOTE — Telephone Encounter (Signed)
Last refilled per controlled substance database: 07/22/21 from prescription that was written on 06/03/21

## 2021-08-08 ENCOUNTER — Other Ambulatory Visit: Payer: Self-pay | Admitting: Internal Medicine

## 2021-08-08 DIAGNOSIS — F419 Anxiety disorder, unspecified: Secondary | ICD-10-CM

## 2021-08-08 MED ORDER — ALPRAZOLAM 0.5 MG PO TABS: 0.5000 mg | ORAL_TABLET | Freq: Two times a day (BID) | ORAL | 0 refills | Status: DC | PRN

## 2021-08-25 ENCOUNTER — Telehealth: Payer: Self-pay | Admitting: Pharmacist

## 2021-08-25 NOTE — Chronic Care Management (AMB) (Signed)
? ? ?Chronic Care Management ?Pharmacy Assistant  ? ?Name: Tonya Cherry  MRN: [id number] DOB: 15-Aug-1950 ? ? ?Reason for Encounter: Disease State / Hyperlipidemia Assessment Call ?  ?Conditions to be addressed/monitored: ?HLD ? ? ?Recent office visits:  ?06/03/2021 Lelon Frohlich MD - Patient was seen for Encounter for preventive health examination and additional issues. Referral to Lung cancer screening, Pulmonary and GYN. No medication changes.  Follow up in 6 months.  ? ?Recent consult visits:  ?None ? ?Hospital visits:  ?None ? ?Medications: ?Outpatient Encounter Medications as of 08/25/2021  ?Medication Sig  ? ALPRAZolam (XANAX) 0.5 MG tablet Take 1 tablet (0.5 mg total) by mouth 2 (two) times daily as needed.  ? atorvastatin (LIPITOR) 20 MG tablet TAKE 1 TABLET BY MOUTH EVERY DAY  ? cholecalciferol (VITAMIN D3) 25 MCG (1000 UT) tablet Take 5,000 Units by mouth 3 (three) times a week.  ? levothyroxine (SYNTHROID) 75 MCG tablet TAKE 1 TABLET BY MOUTH EVERY DAY  ? triamcinolone cream (KENALOG) 0.1 % Apply 1 application topically 2 (two) times daily.  ? Vitamin D, Ergocalciferol, (DRISDOL) 1.25 MG (50000 UNIT) CAPS capsule Take 1 capsule (50,000 Units total) by mouth every 7 (seven) days for 12 doses.  ? ?No facility-administered encounter medications on file as of 08/25/2021.  ?Fill History: ?ALPRAZOLAM 0.5 MG TABLET 07/22/2021 90  ? ?ATORVASTATIN 20 MG TABLET 01/27/2021 90  ? ?VITAMIN D2 1.25MG (50,000 UNIT) 06/08/2021 84  ? ?LEVOTHYROXINE 75 MCG TABLET 06/21/2021 90  ? ? ?08/25/2021 ?Name: Tonya Cherry MRN: [id number] DOB: 07-14-1950 ?Tonya Cherry is a 71 y.o. year old female who is a primary care patient of [name], [name], MD.  ?Comprehensive medication review performed; Spoke to patient regarding cholesterol ? ?Lipid Panel ?   ?Component Value Date/Time  ? CHOL 179 06/03/2021 0721  ? TRIG 99.0 06/03/2021 0721  ? HDL 59.70 06/03/2021 0721  ? [place] 100 (H) 06/03/2021 9735  ? [place] 105  (H) 04/23/2020 0818  ? LDLDIRECT 202.4 05/02/2012 0823  ?  ?10-year ASCVD risk score: The 10-year ASCVD risk score (Arnett DK, et al., 2019) is: 14.8% ?  Values used to calculate the score: ?    Age: 56 years ?    Sex: Female ?    Is Non-Hispanic African American: No ?    Diabetic: No ?    Tobacco smoker: Yes ?    Systolic Blood Pressure: 329 mmHg ?    Is BP treated: No ?    HDL Cholesterol: 59.7 mg/dL ?    Total Cholesterol: 179 mg/dL ? ?Current antihyperlipidemic regimen:  ?Atorvastatin 20 mg daily ? ?Previous antihyperlipidemic medications tried: No other medications tried ? ?ASCVD risk enhancing conditions: age >63 and current smoker ? ?What recent interventions/DTPs have been made by any provider to improve Cholesterol control since last CPP Visit: No recent interventions.  ? ?Any recent hospitalizations or ED visits since last visit with CPP? No recent hospital visits ? ?What diet changes have been made to improve Cholesterol?  ?Breakfast - patient will have cookies ?Lunch - patient will have peanut butter crackers or a banana ?Dinner - patient will have a meal that contains a meat and vegetable ? ?What exercise is being done to improve Cholesterol?  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 ? ?Adherence Review: ?Does the patient have >5 day gap between last estimated fill dates? Yes ? ?Notes: Patient states she stopped Atorvastatin a month or so ago due to  extreme muscle pain, since she discontinued this the pains have resolved.  ? ?Care Gaps: ?AWV - previous message sent to Ramond Craver ?Last BP - 120/80 on 06/03/2021 ?Colonoscopy - never done ?Covid booster - overdue ? ?Star Rating Drugs: ?Atorvastatin 20 mg - [facility] with Maudie Mercury ? ?Gennie Alma CMA  ?Clinical Pharmacist Assistant ?[phone] ? ?

## 2021-09-09 ENCOUNTER — Other Ambulatory Visit: Payer: Self-pay | Admitting: Internal Medicine

## 2021-09-09 DIAGNOSIS — Z1231 Encounter for screening mammogram for malignant neoplasm of breast: Secondary | ICD-10-CM

## 2021-09-12 ENCOUNTER — Ambulatory Visit
Admission: RE | Admit: 2021-09-12 | Discharge: 2021-09-12 | Disposition: A | Discharge status: Home / Self Care | Payer: PPO | Source: Ambulatory Visit | Attending: Internal Medicine | Admitting: Internal Medicine

## 2021-09-12 ENCOUNTER — Other Ambulatory Visit: Payer: Self-pay

## 2021-09-12 DIAGNOSIS — Z1231 Encounter for screening mammogram for malignant neoplasm of breast: Secondary | ICD-10-CM

## 2021-09-13 ENCOUNTER — Ambulatory Visit (INDEPENDENT_AMBULATORY_CARE_PROVIDER_SITE_OTHER): Payer: PPO | Admitting: Internal Medicine

## 2021-09-13 ENCOUNTER — Encounter: Payer: Self-pay | Admitting: Internal Medicine

## 2021-09-13 VITALS — BP 140/70 | HR 98 | Temp 98.0°F | Wt 150.5 lb

## 2021-09-13 DIAGNOSIS — E559 Vitamin D deficiency, unspecified: Secondary | ICD-10-CM

## 2021-09-13 DIAGNOSIS — Z720 Tobacco use: Secondary | ICD-10-CM | POA: Diagnosis not present

## 2021-09-13 DIAGNOSIS — F419 Anxiety disorder, unspecified: Secondary | ICD-10-CM | POA: Diagnosis not present

## 2021-09-13 DIAGNOSIS — F320 Major depressive disorder, single episode, mild: Secondary | ICD-10-CM | POA: Diagnosis not present

## 2021-09-13 DIAGNOSIS — E538 Deficiency of other specified B group vitamins: Secondary | ICD-10-CM

## 2021-09-13 DIAGNOSIS — E785 Hyperlipidemia, unspecified: Secondary | ICD-10-CM | POA: Diagnosis not present

## 2021-09-13 DIAGNOSIS — R7302 Impaired glucose tolerance (oral): Secondary | ICD-10-CM

## 2021-09-13 DIAGNOSIS — E039 Hypothyroidism, unspecified: Secondary | ICD-10-CM | POA: Diagnosis not present

## 2021-09-13 DIAGNOSIS — R195 Other fecal abnormalities: Secondary | ICD-10-CM

## 2021-09-13 LAB — LIPID PANEL
Cholesterol: 237 mg/dL — ABNORMAL HIGH (ref 0–200)
HDL: 63.8 mg/dL (ref >39.00)
LDL Cholesterol: 144 mg/dL — ABNORMAL HIGH (ref 0–99)
NonHDL: 173.65
Total CHOL/HDL Ratio: 4
Triglycerides: 148 mg/dL (ref 0.0–149.0)
VLDL: 29.6 mg/dL (ref 0.0–40.0)

## 2021-09-13 LAB — HEMOGLOBIN A1C: Hgb A1c MFr Bld: 6.1 % (ref 4.6–6.5)

## 2021-09-13 LAB — VITAMIN D 25 HYDROXY (VIT D DEFICIENCY, FRACTURES): VITD: 43.25 ng/mL (ref 30.00–100.00)

## 2021-09-13 LAB — VITAMIN B12: Vitamin B-12: 680 pg/mL (ref 211–911)

## 2021-09-13 MED ORDER — SERTRALINE HCL 50 MG PO TABS: 50.0000 mg | ORAL_TABLET | Freq: Every day | ORAL | 1 refills | Status: DC

## 2021-09-13 MED ORDER — ALPRAZOLAM 0.5 MG PO TABS: 0.5000 mg | ORAL_TABLET | Freq: Two times a day (BID) | ORAL | 2 refills | Status: DC | PRN

## 2021-09-13 MED ORDER — LEVOTHYROXINE SODIUM 75 MCG PO TABS: 75.0000 ug | ORAL_TABLET | Freq: Every day | ORAL | 1 refills | Status: DC

## 2021-09-13 MED ORDER — ROSUVASTATIN CALCIUM 5 MG PO TABS: 5.0000 mg | ORAL_TABLET | ORAL | 1 refills | Status: DC

## 2021-09-13 NOTE — Progress Notes (Signed)
? ? ? ?Established Patient Office Visit ? ? ? ? ?This visit occurred during the SARS-CoV-2 public health emergency.  Safety protocols were in place, including screening questions prior to the visit, additional usage of staff PPE, and extensive cleaning of exam room while observing appropriate contact time as indicated for disinfecting solutions.  ? ? ?CC/Reason for Visit: 18-monthfollow-up chronic conditions ? ?HPI: CKESHONA KARTESis a 71y.o. female who is coming in today for the above mentioned reasons. Past Medical History is significant for: Hypothyroidism, ongoing tobacco abuse, vitamin D and B12 deficiencies, hyperlipidemia, impaired glucose tolerance, osteoporosis.  She had a positive Cologuard in 2020 and has yet to follow-up with GI despite multiple referrals.  She quit taking her atorvastatin due to leg cramps that resolved after discontinuation.  She is wanting to discuss being treated for depression.  She had been on fluoxetine in the past.  She is not interested in CBT. ? ? ?Past Medical/Surgical History: ?Past Medical History:  ?Diagnosis Date  ? Anxiety   ? COPD (chronic obstructive pulmonary disease) (HClements   ? Depression   ? GERD (gastroesophageal reflux disease)   ? Hyperlipidemia   ? Osteoporosis   ? Thyroid disease   ? Ulcer   ? ? ?Past Surgical History:  ?Procedure Laterality Date  ? CESAREAN SECTION    ? ? ?Social History: ? reports that she has been smoking cigarettes. She has been smoking an average of 1 pack per day. She has never used smokeless tobacco. She reports that she does not drink alcohol and does not use drugs. ? ?Allergies: ?Allergies  ?Allergen Reactions  ? Penicillins Cross Reactors Rash  ? Sulfa Antibiotics Rash  ? Other Diarrhea  ?  "mycin"   ? ? ?Family History:  ?Family History  ?Problem Relation Age of Onset  ? Heart disease Mother   ? Stroke Mother   ? Alcohol abuse Father   ? Prostate cancer Father   ? Colon cancer Maternal Grandmother   ? Colon polyps Brother   ?  Breast cancer Neg Hx   ? Esophageal cancer Neg Hx   ? Rectal cancer Neg Hx   ? Stomach cancer Neg Hx   ? Hypercalcemia Neg Hx   ? Pancreatic cancer Neg Hx   ? ? ? ?Current Outpatient Medications:  ?  cholecalciferol (VITAMIN D3) 25 MCG (1000 UT) tablet, Take 5,000 Units by mouth 3 (three) times a week., Disp: , Rfl:  ?  rosuvastatin (CRESTOR) 5 MG tablet, Take 1 tablet (5 mg total) by mouth every other day., Disp: 90 tablet, Rfl: 1 ?  sertraline (ZOLOFT) 50 MG tablet, Take 1 tablet (50 mg total) by mouth daily., Disp: 90 tablet, Rfl: 1 ?  triamcinolone cream (KENALOG) 0.1 %, Apply 1 application topically 2 (two) times daily., Disp: 30 g, Rfl: 1 ?  vitamin B-12 (CYANOCOBALAMIN) 1000 MCG tablet, Take 1,000 mcg by mouth daily., Disp: , Rfl:  ?  ALPRAZolam (XANAX) 0.5 MG tablet, Take 1 tablet (0.5 mg total) by mouth 2 (two) times daily as needed., Disp: 60 tablet, Rfl: 2 ?  levothyroxine (SYNTHROID) 75 MCG tablet, Take 1 tablet (75 mcg total) by mouth daily., Disp: 90 tablet, Rfl: 1 ? ?Review of Systems:  ?Constitutional: Denies fever, chills, diaphoresis, appetite change and fatigue.  ?HEENT: Denies photophobia, eye pain, redness, hearing loss, ear pain, congestion, sore throat, rhinorrhea, sneezing, mouth sores, trouble swallowing, neck pain, neck stiffness and tinnitus.   ?Respiratory: Denies SOB, DOE, cough, chest  tightness,  and wheezing.   ?Cardiovascular: Denies chest pain, palpitations and leg swelling.  ?Gastrointestinal: Denies nausea, vomiting, abdominal pain, diarrhea, constipation, blood in stool and abdominal distention.  ?Genitourinary: Denies dysuria, urgency, frequency, hematuria, flank pain and difficulty urinating.  ?Endocrine: Denies: hot or cold intolerance, sweats, changes in hair or nails, polyuria, polydipsia. ?Musculoskeletal: Denies myalgias, back pain, joint swelling, arthralgias and gait problem.  ?Skin: Denies pallor, rash and wound.  ?Neurological: Denies dizziness, seizures, syncope,  weakness, light-headedness, numbness and headaches.  ?Hematological: Denies adenopathy. Easy bruising, personal or family bleeding history  ?Psychiatric/Behavioral: Denies suicidal ideation,  confusion and agitation ? ? ? ?Physical Exam: ?Vitals:  ? 09/13/21 0730  ?BP: 140/70  ?Pulse: 98  ?Temp: 98 ?F (36.7 ?C)  ?TempSrc: Oral  ?SpO2: 98%  ?Weight: 150 lb 8 oz (68.3 kg)  ? ? ?Body mass index is 26.87 kg/m?. ? ? ?Constitutional: NAD, calm, comfortable ?Eyes: PERRL, lids and conjunctivae normal ?ENMT: Mucous membranes are moist.  ?Respiratory: clear to auscultation bilaterally, no wheezing, no crackles. Normal respiratory effort. No accessory muscle use.  ?Cardiovascular: Regular rate and rhythm, no murmurs / rubs / gallops. No extremity edema.  ?Neurologic: Grossly intact and nonfocal ?Psychiatric: Normal judgment and insight. Alert and oriented x 3. Normal mood.  ? ? ?Impression and Plan: ? ?Acquired hypothyroidism  ?- Plan: levothyroxine (SYNTHROID) 75 MCG tablet ? ?Anxiety ? - Plan: ALPRAZolam (XANAX) 0.5 MG tablet ? ?Impaired glucose tolerance ? - Plan: Hemoglobin A1c ? ?Vitamin D deficiency  ?- Plan: VITAMIN D 25 Hydroxy (Vit-D Deficiency, Fractures) ? ?Dyslipidemia  ?- Plan: rosuvastatin (CRESTOR) 5 MG tablet, Lipid panel ?-She was unable to tolerate atorvastatin 20 mg, will try Crestor 5 mg every other day, check lipids today. ? ?Tobacco abuse ?-She is not interested in discussing smoking cessation, will continue to address at subsequent visits. ? ?Vitamin B12 deficiency  ?- Plan: Vitamin B12 ? ?Positive colorectal cancer screening using Cologuard test ? - Plan: Ambulatory referral to Gastroenterology ?-Importance of follow-up with GI has again been reinforced with her. ? ?Depression, major, single episode, mild ([place] [address])  ?- Plan: sertraline (ZOLOFT) 50 MG tablet ?-Strongly advised CBT but she declines. ?[place] Office Visit from 09/13/2021 in [place] at [place]  ?PHQ-9 Total Score 15  ? ?   ? ? ? ?Time spent: 32 minutes reviewing chart, interviewing and examining patient, counseling on importance of follow-up with colonoscopy given positive Cologuard test, formulating plan of care. ? ? ?Patient Instructions  ?-Nice seeing you today!! ? ?-Lab work today; will notify you once results are available. ? ?-Start sertraline 50 mg daily. ? ?-Start crestor 5 mg every other day. ? ?-Schedule follow up in 6 months for your physical. ? ? ? ?Lelon Frohlich, MD ?[facility] ? ? ?

## 2021-09-13 NOTE — Patient Instructions (Signed)
-  Nice seeing you today!! ? ?-Lab work today; will notify you once results are available. ? ?-Start sertraline 50 mg daily. ? ?-Start crestor 5 mg every other day. ? ?-Schedule follow up in 6 months for your physical. ?

## 2021-09-21 ENCOUNTER — Encounter: Payer: Self-pay | Admitting: Internal Medicine

## 2021-09-21 MED ORDER — SIMVASTATIN 20 MG PO TABS: 20.0000 mg | ORAL_TABLET | Freq: Every day | ORAL | 1 refills | Status: DC

## 2021-12-30 ENCOUNTER — Telehealth: Payer: Self-pay | Admitting: Pharmacist

## 2021-12-30 NOTE — Chronic Care Management (AMB) (Signed)
    Chronic Care Management Pharmacy Assistant   Name: Tonya Cherry  MRN: [id number] DOB: 08-06-1950  12/30/21 APPOINTMENT REMINDER   Called Patient No answer, left message of appointment on 01/02/22 at 3 via telephone visit with Jeni Salles, Pharm D.   Notified to have all medications, supplements, blood pressure and/or blood sugar logs available during appointment and to return call if need to reschedule.      Care Gaps: TDAP - Overdue Zoster Vacine - Overdue COVID Booster - Overdue Colonoscopy - Overdue   Star Rating Drug: Simvastatin 20 mg - Last filled 09/21/21 90 DS Verified     Medications: Outpatient Encounter Medications as of 12/30/2021  Medication Sig   ALPRAZolam (XANAX) 0.5 MG tablet Take 1 tablet (0.5 mg total) by mouth 2 (two) times daily as needed.   cholecalciferol (VITAMIN D3) 25 MCG (1000 UT) tablet Take 5,000 Units by mouth 3 (three) times a week.   levothyroxine (SYNTHROID) 75 MCG tablet Take 1 tablet (75 mcg total) by mouth daily.   sertraline (ZOLOFT) 50 MG tablet Take 1 tablet (50 mg total) by mouth daily.   simvastatin (ZOCOR) 20 MG tablet Take 1 tablet (20 mg total) by mouth at bedtime.   triamcinolone cream (KENALOG) 0.1 % Apply 1 application topically 2 (two) times daily.   vitamin B-12 (CYANOCOBALAMIN) 1000 MCG tablet Take 1,000 mcg by mouth daily.   No facility-administered encounter medications on file as of 12/30/2021.       [place] Clinical Pharmacist Assistant [phone]

## 2022-01-02 ENCOUNTER — Telehealth: Payer: Self-pay | Admitting: Pharmacist

## 2022-01-02 ENCOUNTER — Telehealth: Payer: PPO

## 2022-01-02 NOTE — Progress Notes (Deleted)
Chronic Care Management Pharmacy Note  01/02/2022 Name:  Tonya Cherry MRN:  [id number] DOB:  1950-09-25  Summary: ***  Recommendations/Changes made from today's visit: ***  Plan: ***  Subjective: Tonya Cherry is an 71 y.o. year old female who is a primary patient of Tonya Cherry, Tonya Halsted, MD.  The CCM team was consulted for assistance with disease management and care coordination needs.    Engaged with patient by telephone for follow up visit in response to provider referral for pharmacy case management and/or care coordination services.   Consent to Services:  The patient was given information about Chronic Care Management services, agreed to services, and gave verbal consent prior to initiation of services.  Please see initial visit note for detailed documentation.   Patient Care Team: Tonya Cherry, Tonya Halsted, MD as PCP - General (Internal Medicine) Tonya Cherry, [facility] as Pharmacist (Pharmacist)  Recent office visits: 09/13/21 Tonya Mend, MD: Patient presented for chronic conditions follow up.  Switched atorvastatin to rosuvastatin 5 mg every other day. Prescribed sertraline 50 mg daily. Follow up in 6 months for physical.  Recent consult visits: 07/21/21 Tonya, Larna Daughters [facility] medical): Patient presented for preDM, hypothyroidism, and osteoporosis follow up. Unable to access notes.  Hospital visits: None in previous 6 months  Objective:  Lab Results  Component Value Date   CREATININE 0.9 07/14/2021   BUN 8 07/14/2021   GFR 77.59 06/03/2021   GFRNONAA >90 03/12/2014   GFRAA 79 07/14/2021   NA 143 07/14/2021   K 4.7 07/14/2021   CALCIUM 10.3 07/14/2021   CO2 30 (A) 07/14/2021   GLUCOSE 81 06/03/2021    Lab Results  Component Value Date/Time   HGBA1C 6.1 09/13/2021 08:02 AM   HGBA1C 6.1 06/03/2021 07:21 AM   GFR 77.59 06/03/2021 07:21 AM   GFR 67.61 03/27/2019 08:39 AM    Last diabetic Eye exam: No results found for:  "HMDIABEYEEXA"  Last diabetic Foot exam: No results found for: "HMDIABFOOTEX"   Lab Results  Component Value Date   CHOL 237 (H) 09/13/2021   HDL 63.80 09/13/2021   LDLCALC 144 (H) 09/13/2021   LDLDIRECT 202.4 05/02/2012   TRIG 148.0 09/13/2021   CHOLHDL 4 09/13/2021       Latest Ref Rng & Units 07/14/2021   12:00 AM 06/03/2021    7:21 AM 04/23/2020    8:18 AM  Hepatic Function  Total Protein 6.0 - 8.3 g/dL  6.9  6.8   Albumin 3.5 - 5.0 3.8     4.2    AST 13 - 35 $Re'17     19  17   'kEp$ ALT 7 - 35 $Re'19     14  15   'pbq$ Alk Phosphatase 39 - 117 U/L  79    Total Bilirubin 0.2 - 1.2 mg/dL  0.5  0.6      This result is from an external source.    Lab Results  Component Value Date/Time   TSH 1.32 07/14/2021 12:00 AM   TSH 2.22 06/03/2021 07:21 AM   TSH 1.95 04/23/2020 08:18 AM       Latest Ref Rng & Units 06/03/2021    7:21 AM 04/23/2020    8:18 AM 03/27/2019    8:39 AM  CBC  WBC 4.0 - 10.5 K/uL 5.7  5.7  6.3   Hemoglobin 12.0 - 15.0 g/dL 14.2  14.8  15.4   Hematocrit 36.0 - 46.0 % 42.1  43.4  45.3   Platelets 150.0 -  400.0 K/uL 248.0  277  273.0     Lab Results  Component Value Date/Time   VD25OH 43.25 09/13/2021 08:02 AM   VD25OH 44.8 07/14/2021 12:00 AM   VD25OH 27.94 (L) 06/03/2021 07:21 AM    Clinical ASCVD: No  The 10-year ASCVD risk score (Arnett DK, et al., 2019) is: 16.8%   Values used to calculate the score:     Age: 89 years     Sex: Female     Is Non-Hispanic African American: No     Diabetic: No     Tobacco smoker: Yes     Systolic Blood Pressure: 614 mmHg     Is BP treated: No     HDL Cholesterol: 63.8 mg/dL     Total Cholesterol: 237 mg/dL       09/13/2021    7:49 AM 06/03/2021    1:05 PM 04/22/2020    1:02 PM  Depression screen PHQ 2/9  Decreased Interest 3 1 0  Down, Depressed, Hopeless 3 1 3   PHQ - 2 Score 6 2 3   Altered sleeping 2 2 0  Tired, decreased energy 3 1 2   Change in appetite 1 2 0  Feeling bad or failure about yourself  2 2 0   Trouble concentrating 1 2 1   Moving slowly or fidgety/restless 0 2 0  Suicidal thoughts 0 0 0  PHQ-9 Score 15 13 6   Difficult doing work/chores Very difficult  Not difficult at all     Social History   Tobacco Use  Smoking Status Every Day   Packs/day: 1.00   Types: Cigarettes  Smokeless Tobacco Never   BP Readings from Last 3 Encounters:  09/13/21 140/70  06/03/21 120/80  10/28/20 120/70   Pulse Readings from Last 3 Encounters:  09/13/21 98  06/03/21 85  10/28/20 88   Wt Readings from Last 3 Encounters:  09/13/21 150 lb 8 oz (68.3 kg)  06/03/21 153 lb 9.6 oz (69.7 kg)  10/28/20 155 lb 4.8 oz (70.4 kg)   BMI Readings from Last 3 Encounters:  09/13/21 26.87 kg/m  06/03/21 27.43 kg/m  10/28/20 26.66 kg/m    Assessment/Interventions: Review of patient past medical history, allergies, medications, health status, including review of consultants reports, laboratory and other test data, was performed as part of comprehensive evaluation and provision of chronic care management services.   SDOH:  (Social Determinants of Health) assessments and interventions performed: Yes   SDOH Screenings   Alcohol Screen: Not on file  Depression (PHQ2-9): Medium Risk (09/13/2021)   Depression (PHQ2-9)    PHQ-2 Score: 15  Financial Resource Strain: Low Risk  (10/21/2020)   Overall Financial Resource Strain (CARDIA)    Difficulty of Paying Living Expenses: Not hard at all  Food Insecurity: Not on file  Housing: Not on file  Physical Activity: Not on file  Social Connections: Not on file  Stress: Not on file  Tobacco Use: High Risk (09/13/2021)   Patient History    Smoking Tobacco Use: Every Day    Smokeless Tobacco Use: Never    Passive Exposure: Not on file  Transportation Needs: No Transportation Needs (10/21/2020)   PRAPARE - Transportation    Lack of Transportation (Medical): No    Lack of Transportation (Non-Medical): No   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 She currently lives with her son and is sedentary for most of the day and enjoys watching TV.  She shares the cleaning with her son but she does the cooking. Sometimes they eat out or get takeout a couple times a week. She cooks simple foods such as spaghetti hamburger and chicken and admits that they probably eat too much hamburger. She also eats broccoli or corn or potatoes, pinto beans and does not eat vegetables with every meal. She also admits to eating too many desserts and has something every day including cookies or cake for breakfast with coffee. She has recently cut back on sweet tea and has been drinking more water but does drinks soda every once in a while and coffee every morning.  She is not currently doing any exercise but is going to start walking with the warmer weather. She reports that she sleeps pretty well about 5-6 hours a night and feels rested during the day. She doesn't take naps and doesn't have trouble falling asleep.   Patient doesn't like to take her cholesterol medication but doesn't have a choice. She doesn't have any problems or side effects with her medications.  She reports her doctor wants her to have a normal calcium level in her urine and wants her to start having normal amounts of calcium in her diet as she was staying away from it before.   CCM Care Plan  Allergies  Allergen Reactions   Penicillins Cross Reactors Rash   Sulfa Antibiotics Rash   Other Diarrhea    "mycin"     Medications Reviewed Today     Reviewed by Tonya Cherry, Tonya Halsted, MD (Physician) on 09/13/21 at 0757  Med List Status: <None>   Medication Order Taking? Sig Documenting Provider Last Dose Status Informant  ALPRAZolam (XANAX) 0.5 MG tablet [id number]  Take 1 tablet (0.5 mg total) by mouth 2 (two) times daily as needed. Tonya Cherry, Tonya Halsted, MD  Active   atorvastatin (LIPITOR) 20 MG tablet [id number] No TAKE 1 TABLET BY  MOUTH EVERY DAY  Patient not taking: Reported on 09/13/2021   Tonya Cherry, Tonya Halsted, MD Not Taking Active   cholecalciferol (VITAMIN D3) 25 MCG (1000 UT) tablet [id number] Yes Take 5,000 Units by mouth 3 (three) times a week. [provider] Taking Active   levothyroxine (SYNTHROID) 75 MCG tablet [id number]  Take 1 tablet (75 mcg total) by mouth daily. Tonya Cherry, Tonya Halsted, MD  Active   rosuvastatin (CRESTOR) 5 MG tablet [id number] Yes Take 1 tablet (5 mg total) by mouth every other day. Tonya Cherry, Tonya Halsted, MD  Active   sertraline (ZOLOFT) 50 MG tablet [id number] Yes Take 1 tablet (50 mg total) by mouth daily. Tonya Cherry, Tonya Halsted, MD  Active   triamcinolone cream (KENALOG) 0.1 % [id number] Yes Apply 1 application topically 2 (two) times daily. Tonya Cherry, Tonya Halsted, MD Taking Active   vitamin B-12 (CYANOCOBALAMIN) 1000 MCG tablet [id number] Yes Take 1,000 mcg by mouth daily. [provider] Taking Active             Patient Active Problem List   Diagnosis Date Noted   Hypercalcemia 09/03/2018   Vitamin D deficiency 05/31/2018   Osteoporosis 05/30/2018   Carotid artery stenosis 05/20/2012   Dyslipidemia 05/09/2012   Impaired glucose tolerance 05/09/2012   Tobacco abuse 05/09/2012   Hypothyroidism 01/05/2012    Immunization History  Administered Date(s) Administered   PFIZER(Purple Top)SARS-COV-2 Vaccination 09/04/2019, 10/02/2019, 05/06/2020   Pneumococcal Conjugate-13 04/03/2017   Pneumococcal Polysaccharide-23 05/30/2018   Needs to schedule CPE for October?  Liking simvastatin?  Needs to  schedule colonoscopy  Conditions to be addressed/monitored:  Hyperlipidemia, GERD, Hypothyroidism, Anxiety, Osteoporosis, Tobacco use and Prediabetes and vitamin D deficiency  Conditions addressed this visit: ***  There are no care plans that you recently modified to display for this patient.    Medication Assistance: None required.  Patient  affirms current coverage meets needs.  Compliance/Adherence/Medication fill history: Care Gaps: Colonoscopy, shingrix, COVID booster, tetanus   Star-Rating Drugs: Simvastatin 20 mg - Last filled 09/21/21 90 DS  Patient's preferred pharmacy is:  [id number] [place], [place] - [address] [place] [address] Phone: [phone] Fax: [phone]  Uses pill box? No - not many medicines Pt endorses 95% compliance - once a month missing  We discussed: Current pharmacy is preferred with insurance plan and patient is satisfied with pharmacy services Patient decided to: Continue current medication management strategy  Care Plan and Follow Up Patient Decision:  Patient agrees to Care Plan and Follow-up.  Plan: Telephone follow up appointment with care management team member scheduled for:  6 months  Jeni Salles, PharmD [place] Pharmacist [place] [facility] [place] [phone]

## 2022-01-02 NOTE — Telephone Encounter (Signed)
  Chronic Care Management   Outreach Note  01/02/2022 Name: Tonya Cherry MRN: [id number] DOB: [birth year]  Referred by: [name], [name], MD  Patient had a phone appointment scheduled with clinical pharmacist today.  An unsuccessful telephone outreach was attempted today. The patient was referred to the pharmacist for assistance with care management and care coordination.   If possible, a message was left to return call to: [phone] or to [facility] at [facility]: [place], PharmD, [place] Pharmacist [place] [facility] [place]

## 2022-01-12 ENCOUNTER — Other Ambulatory Visit: Payer: Self-pay | Admitting: Internal Medicine

## 2022-01-12 DIAGNOSIS — F419 Anxiety disorder, unspecified: Secondary | ICD-10-CM

## 2022-01-12 DIAGNOSIS — M810 Age-related osteoporosis without current pathological fracture: Secondary | ICD-10-CM | POA: Diagnosis not present

## 2022-01-12 MED ORDER — ALPRAZOLAM 0.5 MG PO TABS: 0.5000 mg | ORAL_TABLET | Freq: Two times a day (BID) | ORAL | 2 refills | Status: DC | PRN

## 2022-01-12 NOTE — Telephone Encounter (Signed)
Requesting refill of ALPRAZolam (XANAX) 0.5 MG tablet   [id number] [place], [place] - [address] Phone:  [phone] Fax:  [phone]

## 2022-01-12 NOTE — Telephone Encounter (Signed)
Refill sent.

## 2022-01-12 NOTE — Telephone Encounter (Signed)
Last refill was given at office visit 09/13/21

## 2022-01-19 DIAGNOSIS — E559 Vitamin D deficiency, unspecified: Secondary | ICD-10-CM | POA: Diagnosis not present

## 2022-01-19 DIAGNOSIS — E039 Hypothyroidism, unspecified: Secondary | ICD-10-CM | POA: Diagnosis not present

## 2022-01-19 DIAGNOSIS — M810 Age-related osteoporosis without current pathological fracture: Secondary | ICD-10-CM | POA: Diagnosis not present

## 2022-01-19 DIAGNOSIS — E210 Primary hyperparathyroidism: Secondary | ICD-10-CM | POA: Diagnosis not present

## 2022-03-07 ENCOUNTER — Other Ambulatory Visit: Payer: Self-pay | Admitting: Internal Medicine

## 2022-03-07 DIAGNOSIS — R21 Rash and other nonspecific skin eruption: Secondary | ICD-10-CM

## 2022-03-30 ENCOUNTER — Ambulatory Visit (INDEPENDENT_AMBULATORY_CARE_PROVIDER_SITE_OTHER)
Admission: RE | Admit: 2022-03-30 | Discharge: 2022-03-30 | Disposition: A | Discharge status: Home / Self Care | Payer: PPO | Source: Ambulatory Visit | Attending: Internal Medicine | Admitting: Internal Medicine

## 2022-03-30 DIAGNOSIS — M810 Age-related osteoporosis without current pathological fracture: Secondary | ICD-10-CM | POA: Diagnosis not present

## 2022-04-04 ENCOUNTER — Other Ambulatory Visit: Payer: Self-pay | Admitting: Internal Medicine

## 2022-04-04 DIAGNOSIS — F419 Anxiety disorder, unspecified: Secondary | ICD-10-CM

## 2022-04-06 ENCOUNTER — Encounter: Payer: Self-pay | Admitting: Internal Medicine

## 2022-04-06 ENCOUNTER — Ambulatory Visit (INDEPENDENT_AMBULATORY_CARE_PROVIDER_SITE_OTHER): Payer: PPO | Admitting: Internal Medicine

## 2022-04-06 VITALS — BP 138/66 | HR 95 | Temp 98.1°F | Ht 62.75 in | Wt 151.4 lb

## 2022-04-06 DIAGNOSIS — E210 Primary hyperparathyroidism: Secondary | ICD-10-CM | POA: Diagnosis not present

## 2022-04-06 DIAGNOSIS — M810 Age-related osteoporosis without current pathological fracture: Secondary | ICD-10-CM

## 2022-04-06 NOTE — Progress Notes (Signed)
Established Patient Office Visit     CC/Reason for Visit: Discuss bone density results  HPI: Tonya Cherry is a 71 y.o. female who is coming in today for the above mentioned reason.  She had a DEXA scan that has shown progression of her osteoporosis (see results below).  She is noted to have hypercalcemia due to primary hyperparathyroidism.  She has declined bisphosphonate therapy in the past.   Past Medical/Surgical History: Past Medical History:  Diagnosis Date   Anxiety    COPD (chronic obstructive pulmonary disease) (HCC)    Depression    GERD (gastroesophageal reflux disease)    Hyperlipidemia    Osteoporosis    Thyroid disease    Ulcer     Past Surgical History:  Procedure Laterality Date   CESAREAN SECTION      Social History:  reports that she has been smoking cigarettes. She has been smoking an average of 1 pack per day. She has never used smokeless tobacco. She reports that she does not drink alcohol and does not use drugs.  Allergies: Allergies  Allergen Reactions   Penicillins Cross Reactors Rash   Sulfa Antibiotics Rash   Other Diarrhea    "mycin"     Family History:  Family History  Problem Relation Age of Onset   Heart disease Mother    Stroke Mother    Alcohol abuse Father    Prostate cancer Father    Colon cancer Maternal Grandmother    Colon polyps Brother    Breast cancer Neg Hx    Esophageal cancer Neg Hx    Rectal cancer Neg Hx    Stomach cancer Neg Hx    Hypercalcemia Neg Hx    Pancreatic cancer Neg Hx      Current Outpatient Medications:    ALPRAZolam (XANAX) 0.5 MG tablet, TAKE 1 TABLET BY MOUTH 2 TIMES DAILY AS NEEDED., Disp: 60 tablet, Rfl: 0   cholecalciferol (VITAMIN D3) 25 MCG (1000 UT) tablet, Take 5,000 Units by mouth 3 (three) times a week., Disp: , Rfl:    levothyroxine (SYNTHROID) 75 MCG tablet, Take 1 tablet (75 mcg total) by mouth daily., Disp: 90 tablet, Rfl: 1   sertraline (ZOLOFT) 50 MG tablet, Take 1  tablet (50 mg total) by mouth daily., Disp: 90 tablet, Rfl: 1   simvastatin (ZOCOR) 20 MG tablet, Take 1 tablet (20 mg total) by mouth at bedtime., Disp: 90 tablet, Rfl: 1   triamcinolone cream (KENALOG) 0.1 %, APPLY TO AFFECTED AREA TWICE A DAY, Disp: 30 g, Rfl: 1   vitamin B-12 (CYANOCOBALAMIN) 1000 MCG tablet, Take 1,000 mcg by mouth daily., Disp: , Rfl:   Review of Systems:  Constitutional: Denies fever, chills, diaphoresis, appetite change and fatigue.  HEENT: Denies photophobia, eye pain, redness, hearing loss, ear pain, congestion, sore throat, rhinorrhea, sneezing, mouth sores, trouble swallowing, neck pain, neck stiffness and tinnitus.   Respiratory: Denies SOB, DOE, cough, chest tightness,  and wheezing.   Cardiovascular: Denies chest pain, palpitations and leg swelling.  Gastrointestinal: Denies nausea, vomiting, abdominal pain, diarrhea, constipation, blood in stool and abdominal distention.  Genitourinary: Denies dysuria, urgency, frequency, hematuria, flank pain and difficulty urinating.  Endocrine: Denies: hot or cold intolerance, sweats, changes in hair or nails, polyuria, polydipsia. Musculoskeletal: Denies myalgias, back pain, joint swelling, arthralgias and gait problem.  Skin: Denies pallor, rash and wound.  Neurological: Denies dizziness, seizures, syncope, weakness, light-headedness, numbness and headaches.  Hematological: Denies adenopathy. Easy bruising, personal or family  bleeding history  Psychiatric/Behavioral: Denies suicidal ideation, mood changes, confusion, nervousness, sleep disturbance and agitation    Physical Exam: Vitals:   04/06/22 1500  BP: 138/66  Pulse: 95  Temp: 98.1 F (36.7 C)  TempSrc: Oral  SpO2: 98%  Weight: 151 lb 6.4 oz (68.7 kg)  Height: 5' 2.75" (1.594 m)    Body mass index is 27.03 kg/m.   Constitutional: NAD, calm, comfortable Eyes: PERRL, lids and conjunctivae normal ENMT: Mucous membranes are moist.   Psychiatric: Normal  judgment and insight. Alert and oriented x 3. Normal mood.    Impression and Plan:  Osteoporosis without current pathological fracture, unspecified osteoporosis type  Hypercalcemia  Primary hyperparathyroidism ([place])  -Declines flu and COVID vaccines today despite counseling. -DEXA scan shows worsening bone density, left femoral neck T score is -3.2, right femoral neck is -2.8 and radius is -3.1.  This would be an indication to have a parathyroidectomy.  Have urged her to discuss this with her endocrinologist and consider surgical referral.  Time spent:30 minutes reviewing chart, interviewing and examining patient and formulating plan of care.      Lelon Frohlich, MD [facility] at [facility]

## 2022-04-09 ENCOUNTER — Other Ambulatory Visit: Payer: Self-pay | Admitting: Internal Medicine

## 2022-05-07 ENCOUNTER — Other Ambulatory Visit: Payer: Self-pay | Admitting: Internal Medicine

## 2022-05-07 DIAGNOSIS — F419 Anxiety disorder, unspecified: Secondary | ICD-10-CM

## 2022-06-04 ENCOUNTER — Other Ambulatory Visit: Payer: Self-pay | Admitting: Internal Medicine

## 2022-06-04 DIAGNOSIS — F419 Anxiety disorder, unspecified: Secondary | ICD-10-CM

## 2022-07-21 DIAGNOSIS — E559 Vitamin D deficiency, unspecified: Secondary | ICD-10-CM | POA: Diagnosis not present

## 2022-07-21 DIAGNOSIS — E039 Hypothyroidism, unspecified: Secondary | ICD-10-CM | POA: Diagnosis not present

## 2022-07-21 DIAGNOSIS — E210 Primary hyperparathyroidism: Secondary | ICD-10-CM | POA: Diagnosis not present

## 2022-07-27 DIAGNOSIS — M810 Age-related osteoporosis without current pathological fracture: Secondary | ICD-10-CM | POA: Diagnosis not present

## 2022-07-27 DIAGNOSIS — E559 Vitamin D deficiency, unspecified: Secondary | ICD-10-CM | POA: Diagnosis not present

## 2022-07-27 DIAGNOSIS — E210 Primary hyperparathyroidism: Secondary | ICD-10-CM | POA: Diagnosis not present

## 2022-07-27 DIAGNOSIS — E039 Hypothyroidism, unspecified: Secondary | ICD-10-CM | POA: Diagnosis not present

## 2022-08-02 ENCOUNTER — Other Ambulatory Visit: Payer: Self-pay | Admitting: Internal Medicine

## 2022-08-02 DIAGNOSIS — E039 Hypothyroidism, unspecified: Secondary | ICD-10-CM

## 2022-08-02 DIAGNOSIS — F419 Anxiety disorder, unspecified: Secondary | ICD-10-CM

## 2022-09-29 ENCOUNTER — Other Ambulatory Visit: Payer: Self-pay | Admitting: Internal Medicine

## 2022-09-29 DIAGNOSIS — F419 Anxiety disorder, unspecified: Secondary | ICD-10-CM

## 2022-10-02 ENCOUNTER — Other Ambulatory Visit: Payer: Self-pay | Admitting: Internal Medicine

## 2022-10-02 DIAGNOSIS — F419 Anxiety disorder, unspecified: Secondary | ICD-10-CM

## 2022-10-02 NOTE — Telephone Encounter (Signed)
Pt called to FU on this refill request.  Pt reminded that MD is OOO until Wednesday.

## 2022-10-02 NOTE — Telephone Encounter (Signed)
Prescription Request  10/02/2022  LOV: 04/06/2022  What is the name of the medication or equipment? Alprazolam (Xanax).  Have you contacted your pharmacy to request a refill? Yes   Which pharmacy would you like this sent to?  [id number] - [place], [place] - [address] [place] [address] Phone: [phone] Fax: [phone]    Patient notified that their request is being sent to the clinical staff for review and that they should receive a response within 2 business days.   Please advise at Mobile [phone] (mobile)

## 2022-10-03 ENCOUNTER — Other Ambulatory Visit: Payer: Self-pay | Admitting: Internal Medicine

## 2022-10-03 DIAGNOSIS — F419 Anxiety disorder, unspecified: Secondary | ICD-10-CM

## 2022-10-24 ENCOUNTER — Other Ambulatory Visit: Payer: Self-pay | Admitting: Internal Medicine

## 2022-10-24 DIAGNOSIS — Z1231 Encounter for screening mammogram for malignant neoplasm of breast: Secondary | ICD-10-CM

## 2022-11-28 ENCOUNTER — Ambulatory Visit
Admission: RE | Admit: 2022-11-28 | Discharge: 2022-11-28 | Disposition: A | Discharge status: Home / Self Care | Payer: PPO | Source: Ambulatory Visit | Attending: Internal Medicine | Admitting: Internal Medicine

## 2022-11-28 DIAGNOSIS — Z1231 Encounter for screening mammogram for malignant neoplasm of breast: Secondary | ICD-10-CM

## 2022-11-29 ENCOUNTER — Other Ambulatory Visit: Payer: Self-pay | Admitting: Internal Medicine

## 2022-11-29 DIAGNOSIS — F419 Anxiety disorder, unspecified: Secondary | ICD-10-CM

## 2022-12-21 ENCOUNTER — Encounter: Payer: Self-pay | Admitting: Internal Medicine

## 2022-12-27 ENCOUNTER — Ambulatory Visit (INDEPENDENT_AMBULATORY_CARE_PROVIDER_SITE_OTHER): Payer: PPO | Admitting: Internal Medicine

## 2022-12-27 VITALS — BP 120/82 | HR 86 | Temp 97.7°F | Ht 62.5 in | Wt 148.0 lb

## 2022-12-27 DIAGNOSIS — E538 Deficiency of other specified B group vitamins: Secondary | ICD-10-CM

## 2022-12-27 DIAGNOSIS — Z0100 Encounter for examination of eyes and vision without abnormal findings: Secondary | ICD-10-CM

## 2022-12-27 DIAGNOSIS — Z720 Tobacco use: Secondary | ICD-10-CM

## 2022-12-27 DIAGNOSIS — E785 Hyperlipidemia, unspecified: Secondary | ICD-10-CM

## 2022-12-27 DIAGNOSIS — R7302 Impaired glucose tolerance (oral): Secondary | ICD-10-CM

## 2022-12-27 DIAGNOSIS — Z0000 Encounter for general adult medical examination without abnormal findings: Secondary | ICD-10-CM | POA: Diagnosis not present

## 2022-12-27 DIAGNOSIS — E559 Vitamin D deficiency, unspecified: Secondary | ICD-10-CM | POA: Diagnosis not present

## 2022-12-27 DIAGNOSIS — F419 Anxiety disorder, unspecified: Secondary | ICD-10-CM

## 2022-12-27 DIAGNOSIS — Z1211 Encounter for screening for malignant neoplasm of colon: Secondary | ICD-10-CM

## 2022-12-27 DIAGNOSIS — Z122 Encounter for screening for malignant neoplasm of respiratory organs: Secondary | ICD-10-CM

## 2022-12-27 LAB — COMPREHENSIVE METABOLIC PANEL
ALT: 15 U/L (ref 0–35)
AST: 20 U/L (ref 0–37)
Albumin: 4.5 g/dL (ref 3.5–5.2)
Alkaline Phosphatase: 101 U/L (ref 39–117)
BUN: 10 mg/dL (ref 6–23)
CO2: 29 mEq/L (ref 19–32)
Calcium: 11.6 mg/dL — ABNORMAL HIGH (ref 8.4–10.5)
Chloride: 105 mEq/L (ref 96–112)
Creatinine, Ser: 0.85 mg/dL (ref 0.40–1.20)
GFR: 69.22 mL/min (ref >60.00)
Glucose, Bld: 91 mg/dL (ref 70–99)
Potassium: 4.6 mEq/L (ref 3.5–5.1)
Sodium: 140 mEq/L (ref 135–145)
Total Bilirubin: 0.4 mg/dL (ref 0.2–1.2)
Total Protein: 7.6 g/dL (ref 6.0–8.3)

## 2022-12-27 LAB — VITAMIN D 25 HYDROXY (VIT D DEFICIENCY, FRACTURES): VITD: 33.39 ng/mL (ref 30.00–100.00)

## 2022-12-27 LAB — CBC WITH DIFFERENTIAL/PLATELET
Basophils Absolute: 0 10*3/uL (ref 0.0–0.1)
Basophils Relative: 0.8 % (ref 0.0–3.0)
Eosinophils Absolute: 0.5 10*3/uL (ref 0.0–0.7)
Eosinophils Relative: 8.3 % — ABNORMAL HIGH (ref 0.0–5.0)
HCT: 45.6 % (ref 36.0–46.0)
Hemoglobin: 15.1 g/dL — ABNORMAL HIGH (ref 12.0–15.0)
Lymphocytes Relative: 34.7 % (ref 12.0–46.0)
Lymphs Abs: 2.1 10*3/uL (ref 0.7–4.0)
MCHC: 33.1 g/dL (ref 30.0–36.0)
MCV: 95.6 fl (ref 78.0–100.0)
Monocytes Absolute: 0.5 10*3/uL (ref 0.1–1.0)
Monocytes Relative: 7.8 % (ref 3.0–12.0)
Neutro Abs: 2.9 10*3/uL (ref 1.4–7.7)
Neutrophils Relative %: 48.4 % (ref 43.0–77.0)
Platelets: 311 10*3/uL (ref 150.0–400.0)
RBC: 4.77 Mil/uL (ref 3.87–5.11)
RDW: 13.3 % (ref 11.5–15.5)
WBC: 6.1 10*3/uL (ref 4.0–10.5)

## 2022-12-27 LAB — LIPID PANEL
Cholesterol: 278 mg/dL — ABNORMAL HIGH (ref 0–200)
HDL: 60.9 mg/dL (ref >39.00)
LDL Cholesterol: 189 mg/dL — ABNORMAL HIGH (ref 0–99)
NonHDL: 216.6
Total CHOL/HDL Ratio: 5
Triglycerides: 140 mg/dL (ref 0.0–149.0)
VLDL: 28 mg/dL (ref 0.0–40.0)

## 2022-12-27 LAB — VITAMIN B12: Vitamin B-12: 667 pg/mL (ref 211–911)

## 2022-12-27 LAB — TSH: TSH: 1.39 u[IU]/mL (ref 0.35–5.50)

## 2022-12-27 LAB — HEMOGLOBIN A1C: Hgb A1c MFr Bld: 5.8 % (ref 4.6–6.5)

## 2022-12-27 NOTE — Patient Instructions (Signed)
-  Nice seeing you today!!  -Lab work today; will notify you once results are available.  -Update these vaccines at the pharmacy: Tdap, shingles, RSV, COVID.  -See you back in 6 months.

## 2022-12-27 NOTE — Progress Notes (Signed)
Established Patient Office Visit     CC/Reason for Visit: Annual preventive exam and subsequent Medicare wellness visit  HPI: Tonya Cherry is a 72 y.o. female who is coming in today for the above mentioned reasons. Past Medical History is significant for: Hyperparathyroidism with progressive osteoporosis, hypothyroidism, ongoing tobacco use, hyperlipidemia, impaired glucose tolerance, vitamin D  and B12 deficiencies.  She is overdue for an eye exam, no dental care.  She had a positive Cologuard in 2020 and has refused colonoscopy in the past.  She seems agreeable today.  She sees endocrinology in August given her history of hyperparathyroidism and worsening osteoporosis.   Past Medical/Surgical History: Past Medical History:  Diagnosis Date   Anxiety    COPD (chronic obstructive pulmonary disease) (HCC)    Depression    GERD (gastroesophageal reflux disease)    Hyperlipidemia    Osteoporosis    Thyroid disease    Ulcer     Past Surgical History:  Procedure Laterality Date   CESAREAN SECTION      Social History:  reports that she has been smoking cigarettes. She has been smoking an average of 1 pack per day. She has never used smokeless tobacco. She reports that she does not drink alcohol and does not use drugs.  Allergies: Allergies  Allergen Reactions   Penicillins Cross Reactors Rash   Sulfa Antibiotics Rash   Other Diarrhea    "mycin"     Family History:  Family History  Problem Relation Age of Onset   Heart disease Mother    Stroke Mother    Alcohol abuse Father    Prostate cancer Father    Colon cancer Maternal Grandmother    Colon polyps Brother    Breast cancer Neg Hx    Esophageal cancer Neg Hx    Rectal cancer Neg Hx    Stomach cancer Neg Hx    Hypercalcemia Neg Hx    Pancreatic cancer Neg Hx      Current Outpatient Medications:    ALPRAZolam (XANAX) 0.5 MG tablet, TAKE 1 TABLET BY MOUTH TWICE A DAY AS NEEDED, Disp: 60 tablet, Rfl: 1    Cholecalciferol (VITAMIN D-3) 125 MCG (5000 UT) TABS, Take by mouth., Disp: , Rfl:    levothyroxine (SYNTHROID) 75 MCG tablet, TAKE 1 TABLET BY MOUTH EVERY DAY, Disp: 90 tablet, Rfl: 1   triamcinolone cream (KENALOG) 0.1 %, APPLY TO AFFECTED AREA TWICE A DAY, Disp: 30 g, Rfl: 1   vitamin B-12 (CYANOCOBALAMIN) 1000 MCG tablet, Take 1,000 mcg by mouth daily., Disp: , Rfl:    sertraline (ZOLOFT) 50 MG tablet, Take 1 tablet (50 mg total) by mouth daily. (Patient not taking: Reported on 12/21/2022), Disp: 90 tablet, Rfl: 1   simvastatin (ZOCOR) 20 MG tablet, TAKE 1 TABLET BY MOUTH EVERYDAY AT BEDTIME (Patient not taking: Reported on 12/21/2022), Disp: 90 tablet, Rfl: 0  Review of Systems:  Negative unless indicated in HPI.   Physical Exam: Vitals:   12/21/22 1131  BP: 120/82  Pulse: 86  Temp: 97.7 F (36.5 C)  TempSrc: Oral  SpO2: 97%  Weight: 148 lb (67.1 kg)  Height: 5' 2.5" (1.588 m)    Body mass index is 26.64 kg/m.   Physical Exam Vitals reviewed.  Constitutional:      General: She is not in acute distress.    Appearance: Normal appearance. She is not ill-appearing, toxic-appearing or diaphoretic.  HENT:     Head: Normocephalic.     Right  Ear: Tympanic membrane, ear canal and external ear normal. There is no impacted cerumen.     Left Ear: Tympanic membrane, ear canal and external ear normal. There is no impacted cerumen.     Nose: Nose normal.     Mouth/Throat:     Mouth: Mucous membranes are moist.     Pharynx: Oropharynx is clear. No oropharyngeal exudate or posterior oropharyngeal erythema.  Eyes:     General: No scleral icterus.       Right eye: No discharge.        Left eye: No discharge.     Conjunctiva/sclera: Conjunctivae normal.     Pupils: Pupils are equal, round, and reactive to light.  Neck:     Vascular: No carotid bruit.  Cardiovascular:     Rate and Rhythm: Normal rate and regular rhythm.     Pulses: Normal pulses.     Heart sounds: Normal heart  sounds.  Pulmonary:     Effort: Pulmonary effort is normal. No respiratory distress.     Breath sounds: Normal breath sounds.  Abdominal:     General: Abdomen is flat. Bowel sounds are normal.     Palpations: Abdomen is soft.  Musculoskeletal:        General: Normal range of motion.     Cervical back: Normal range of motion.  Skin:    General: Skin is warm and dry.  Neurological:     General: No focal deficit present.     Mental Status: She is alert and oriented to person, place, and time. Mental status is at baseline.  Psychiatric:        Mood and Affect: Mood normal.        Behavior: Behavior normal.        Thought Content: Thought content normal.        Judgment: Judgment normal.    Subsequent Medicare wellness visit   1. Risk factors, based on past  M,S,F - Cardiac Risk Factors include: advanced age (>39men, >75 women)   2.  Physical activities: Dietary issues and exercise activities discussed:      3.  Depression/mood:  Flowsheet Row Office Visit from 12/27/2022 in [facility] Total Score 4        4.  ADL's:    12/27/2022    8:54 AM 12/21/2022   11:29 AM  In your present state of health, do you have any difficulty performing the following activities:  Hearing? 0 0  Vision? 0 0  Difficulty concentrating or making decisions? 0 0  Walking or climbing stairs? 0 0  Dressing or bathing? 0 0  Doing errands, shopping? 0 0  Preparing Food and eating ? N N  Using the Toilet? N N  In the past six months, have you accidently leaked urine? N N  Do you have problems with loss of bowel control? N N  Managing your Medications? N N  Managing your Finances? N N  Housekeeping or managing your Housekeeping? N N     5.  Fall risk:     06/03/2021    1:05 PM 09/13/2021    7:49 AM 12/21/2022   11:34 AM 12/27/2022    8:54 AM 12/27/2022    9:24 AM  Fall Risk  Falls in the past year? 0 0 0 0 0  Was there an injury with Fall? 0 0 0 0 0  Fall  Risk Category Calculator 0 0 0 0 0  Fall Risk  Category (Retired) Low Low     (RETIRED) Patient Fall Risk Level  Low fall risk     Patient at Risk for Falls Due to  No Fall Risks   No Fall Risks  Fall risk Follow up  Falls evaluation completed Falls evaluation completed  Falls evaluation completed     6.  Home safety: No problems identified   7.  Height weight, and visual acuity: height and weight as above, vision/hearing: Vision Screening   Right eye Left eye Both eyes  Without correction 20/50-1 20/50-1 20/30  With correction        8.  Counseling: Ready to quit: Not Answered Counseling given: Not Answered    9. Lab orders based on risk factors: Laboratory update will be reviewed   10. Cognitive assessment:        12/21/2022   11:34 AM  6CIT Screen  What Year? 0 points  What month? 0 points  What time? 0 points  Count back from 20 0 points  Months in reverse 0 points  Repeat phrase 0 points  Total Score 0 points     11. Screening: Patient provided with a written and personalized 5-10 year screening schedule in the AVS. Health Maintenance  Topic Date Due   DTaP/Tdap/Td vaccine (1 - Tdap) Never done   Zoster (Shingles) Vaccine (1 of 2) Never done   COVID-19 Vaccine (4 - 2023-24 season) 02/24/2022   Colon Cancer Screening  12/21/2023*   Flu Shot  01/25/2023   Medicare Annual Wellness Visit  12/27/2023   Mammogram  11/27/2024   Pneumonia Vaccine  Completed   DEXA scan (bone density measurement)  Completed   Hepatitis C Screening  Completed   HPV Vaccine  Aged Out  *Topic was postponed. The date shown is not the original due date.    12. Provider List Update: Patient Care Team    Relationship Specialty Notifications Start End  Philip Aspen, Limmie Patricia, MD PCP - General Internal Medicine  05/30/18   Verner Chol, [facility] (Inactive) Pharmacist Pharmacist  08/20/20    Comment: Phone: [phone]     13. Advance Directives: Does Patient Have a Medical Advance  Directive?: No Would patient like information on creating a medical advance directive?: No - Patient declined  14. Opioids: Patient is not on any opioid prescriptions and has no risk factors for a substance use disorder.   15.   Goals      retire         I have personally reviewed and noted the following in the patient's chart:   Medical and social history Use of alcohol, tobacco or illicit drugs  Current medications and supplements Functional ability and status Nutritional status Physical activity Advanced directives List of other physicians Hospitalizations, surgeries, and ER visits in previous 12 months Vitals Screenings to include cognitive, depression, and falls Referrals and appointments  In addition, I have reviewed and discussed with patient certain preventive protocols, quality metrics, and best practice recommendations. A written personalized care plan for preventive services as well as general preventive health recommendations were provided to patient.   Impression and Plan:  Screening for malignant neoplasm of colon -     Ambulatory referral to Gastroenterology  Hypercalcemia  Anxiety -     TSH; Future  Impaired glucose tolerance -     Comprehensive metabolic panel; Future -     CBC with Differential/Platelet; Future -     Hemoglobin A1c; Future  Vitamin D deficiency -  VITAMIN D 25 Hydroxy (Vit-D Deficiency, Fractures); Future  Dyslipidemia -     Lipid panel; Future  Tobacco abuse  Vitamin B12 deficiency -     Vitamin B12; Future  Medicare annual wellness visit, subsequent  Screening for lung cancer -     Ambulatory Referral for Lung Cancer Scre  Encounter for vision screening -     Ambulatory referral to Ophthalmology   -Recommend routine eye and dental care. -Healthy lifestyle discussed in detail. -Labs to be updated today. -Prostate cancer screening: N/A Health Maintenance  Topic Date Due   DTaP/Tdap/Td vaccine (1 - Tdap)  Never done   Zoster (Shingles) Vaccine (1 of 2) Never done   COVID-19 Vaccine (4 - 2023-24 season) 02/24/2022   Colon Cancer Screening  12/21/2023*   Flu Shot  01/25/2023   Medicare Annual Wellness Visit  12/27/2023   Mammogram  11/27/2024   Pneumonia Vaccine  Completed   DEXA scan (bone density measurement)  Completed   Hepatitis C Screening  Completed   HPV Vaccine  Aged Out  *Topic was postponed. The date shown is not the original due date.     -Advised to update Tdap, shingles, RSV vaccines at pharmacy.  Referral to ophthalmology placed. -She has finally agreed to a GI referral for colonoscopy.  She had a positive Cologuard in 2020 and has never had a colonoscopy. -Referral placed for lung cancer screening as she is a current smoker.     Chaya Jan, MD [facility] at [facility]

## 2023-01-10 ENCOUNTER — Encounter: Payer: Self-pay | Admitting: Internal Medicine

## 2023-01-10 ENCOUNTER — Other Ambulatory Visit: Payer: Self-pay | Admitting: Internal Medicine

## 2023-01-10 DIAGNOSIS — E210 Primary hyperparathyroidism: Secondary | ICD-10-CM | POA: Insufficient documentation

## 2023-01-10 MED ORDER — SIMVASTATIN 20 MG PO TABS: 20.0000 mg | ORAL_TABLET | Freq: Every day | ORAL | 1 refills | Status: DC

## 2023-01-26 DIAGNOSIS — E210 Primary hyperparathyroidism: Secondary | ICD-10-CM | POA: Diagnosis not present

## 2023-01-26 DIAGNOSIS — E039 Hypothyroidism, unspecified: Secondary | ICD-10-CM | POA: Diagnosis not present

## 2023-01-26 DIAGNOSIS — M810 Age-related osteoporosis without current pathological fracture: Secondary | ICD-10-CM | POA: Diagnosis not present

## 2023-01-28 ENCOUNTER — Other Ambulatory Visit: Payer: Self-pay | Admitting: Internal Medicine

## 2023-01-28 DIAGNOSIS — F419 Anxiety disorder, unspecified: Secondary | ICD-10-CM

## 2023-02-02 DIAGNOSIS — M810 Age-related osteoporosis without current pathological fracture: Secondary | ICD-10-CM | POA: Diagnosis not present

## 2023-02-02 DIAGNOSIS — E210 Primary hyperparathyroidism: Secondary | ICD-10-CM | POA: Diagnosis not present

## 2023-02-02 DIAGNOSIS — E039 Hypothyroidism, unspecified: Secondary | ICD-10-CM | POA: Diagnosis not present

## 2023-02-02 DIAGNOSIS — E559 Vitamin D deficiency, unspecified: Secondary | ICD-10-CM | POA: Diagnosis not present

## 2023-02-07 ENCOUNTER — Encounter: Payer: Self-pay | Admitting: Internal Medicine

## 2023-02-08 ENCOUNTER — Encounter: Payer: Self-pay | Admitting: Internal Medicine

## 2023-03-13 ENCOUNTER — Other Ambulatory Visit: Payer: Self-pay | Admitting: Internal Medicine

## 2023-03-13 DIAGNOSIS — E039 Hypothyroidism, unspecified: Secondary | ICD-10-CM

## 2023-03-25 ENCOUNTER — Other Ambulatory Visit: Payer: Self-pay | Admitting: Internal Medicine

## 2023-03-25 DIAGNOSIS — F419 Anxiety disorder, unspecified: Secondary | ICD-10-CM

## 2023-04-25 ENCOUNTER — Other Ambulatory Visit: Payer: Self-pay | Admitting: Family Medicine

## 2023-04-25 DIAGNOSIS — Z08 Encounter for follow-up examination after completed treatment for malignant neoplasm: Secondary | ICD-10-CM | POA: Diagnosis not present

## 2023-04-25 DIAGNOSIS — L570 Actinic keratosis: Secondary | ICD-10-CM | POA: Diagnosis not present

## 2023-04-25 DIAGNOSIS — D225 Melanocytic nevi of trunk: Secondary | ICD-10-CM | POA: Diagnosis not present

## 2023-04-25 DIAGNOSIS — D485 Neoplasm of uncertain behavior of skin: Secondary | ICD-10-CM | POA: Diagnosis not present

## 2023-04-25 DIAGNOSIS — C44519 Basal cell carcinoma of skin of other part of trunk: Secondary | ICD-10-CM | POA: Diagnosis not present

## 2023-04-25 DIAGNOSIS — F419 Anxiety disorder, unspecified: Secondary | ICD-10-CM

## 2023-04-25 DIAGNOSIS — Z85828 Personal history of other malignant neoplasm of skin: Secondary | ICD-10-CM | POA: Diagnosis not present

## 2023-04-25 DIAGNOSIS — L821 Other seborrheic keratosis: Secondary | ICD-10-CM | POA: Diagnosis not present

## 2023-04-25 DIAGNOSIS — L301 Dyshidrosis [pompholyx]: Secondary | ICD-10-CM | POA: Diagnosis not present

## 2023-04-25 DIAGNOSIS — L814 Other melanin hyperpigmentation: Secondary | ICD-10-CM | POA: Diagnosis not present

## 2023-04-25 NOTE — Telephone Encounter (Signed)
Pt LOV was7/30/24 Last refill was on 03/27/23 Please advise

## 2023-05-11 ENCOUNTER — Other Ambulatory Visit: Payer: Self-pay

## 2023-05-11 NOTE — Progress Notes (Signed)
   05/11/2023  Patient ID: Lenna Sciara, female   DOB: [birth year], 72 y.o.   MRN: [id number]  Pharmacy Quality Measure Review  This patient is appearing on a report for being at risk of failing the adherence measure for cholesterol (statin) medications this calendar year.   Medication: Simvastatin 20mg  Last fill date: 01/10/23 for 90 day supply  Discussed barriers to adherence, which included headaches in the past, Reviewed medication indication, dosing, and goals of therapy. , and Contacted pharmacy to facilitate refills.   Pharmacy filling today and patient will pick up.  Sherrill Raring, PharmD Clinical Pharmacist [phone]

## 2023-05-24 ENCOUNTER — Other Ambulatory Visit: Payer: Self-pay | Admitting: Internal Medicine

## 2023-05-24 DIAGNOSIS — F419 Anxiety disorder, unspecified: Secondary | ICD-10-CM

## 2023-05-25 ENCOUNTER — Other Ambulatory Visit: Payer: Self-pay | Admitting: Family Medicine

## 2023-05-25 DIAGNOSIS — F419 Anxiety disorder, unspecified: Secondary | ICD-10-CM

## 2023-05-28 MED ORDER — ALPRAZOLAM 0.5 MG PO TABS: 0.5000 mg | ORAL_TABLET | Freq: Two times a day (BID) | ORAL | 0 refills | Status: DC | PRN

## 2023-05-29 DIAGNOSIS — C44619 Basal cell carcinoma of skin of left upper limb, including shoulder: Secondary | ICD-10-CM | POA: Diagnosis not present

## 2023-06-26 ENCOUNTER — Other Ambulatory Visit: Payer: Self-pay | Admitting: Internal Medicine

## 2023-06-26 DIAGNOSIS — F419 Anxiety disorder, unspecified: Secondary | ICD-10-CM

## 2023-06-28 ENCOUNTER — Other Ambulatory Visit: Payer: Self-pay | Admitting: Internal Medicine

## 2023-06-28 DIAGNOSIS — F419 Anxiety disorder, unspecified: Secondary | ICD-10-CM

## 2023-06-29 ENCOUNTER — Telehealth: Payer: Self-pay | Admitting: *Deleted

## 2023-06-29 NOTE — Telephone Encounter (Signed)
 Copied from CRM [id number]. Topic: Clinical - Medication Refill >> Jun 29, 2023  9:01 AM Leila C wrote: Most Recent Primary Care Visit:  Provider: THEOPHILUS DELMA TULLY CINDERELLA  Department: [facility]  Visit Type: MEDICARE AWV, SEQUENTIAL  Date: 12/27/2022  Medication: ALPRAZolam  (XANAX ) 0.5 MG tablet   Has the patient contacted their pharmacy? Yes (Agent: If no, request that the patient contact the pharmacy for the refill. If patient does not wish to contact the pharmacy document the reason why and proceed with request.) (Agent: If yes, when and what did the pharmacy advise?)  Is this the correct pharmacy for this prescription? Yes If no, delete pharmacy and type the correct one.  This is the patient's preferred pharmacy:  [id number] - [place], [place] - [address] [place] [address] Phone: [phone] Fax: [phone]   Has the prescription been filled recently?   Is the patient out of the medication? Yes  Has the patient been seen for an appointment in the last year OR does the patient have an upcoming appointment?   Can we respond through MyChart? No, [phone]  Agent: Please be advised that Rx refills may take up to 3 business days. We ask that you follow-up with your pharmacy.

## 2023-06-30 ENCOUNTER — Other Ambulatory Visit: Payer: Self-pay | Admitting: Internal Medicine

## 2023-06-30 DIAGNOSIS — F419 Anxiety disorder, unspecified: Secondary | ICD-10-CM

## 2023-07-02 ENCOUNTER — Encounter: Payer: Self-pay | Admitting: Internal Medicine

## 2023-07-02 MED ORDER — ALPRAZOLAM 0.5 MG PO TABS
0.5000 mg | ORAL_TABLET | Freq: Two times a day (BID) | ORAL | 0 refills | Status: DC | PRN
Start: 2023-07-02 — End: 2023-07-31

## 2023-07-03 NOTE — Telephone Encounter (Signed)
 Refill sent.

## 2023-07-09 ENCOUNTER — Emergency Department (HOSPITAL_BASED_OUTPATIENT_CLINIC_OR_DEPARTMENT_OTHER): Payer: PPO

## 2023-07-09 ENCOUNTER — Encounter (HOSPITAL_BASED_OUTPATIENT_CLINIC_OR_DEPARTMENT_OTHER): Payer: Self-pay

## 2023-07-09 ENCOUNTER — Other Ambulatory Visit: Payer: Self-pay

## 2023-07-09 ENCOUNTER — Emergency Department (HOSPITAL_BASED_OUTPATIENT_CLINIC_OR_DEPARTMENT_OTHER)
Admission: EM | Admit: 2023-07-09 | Discharge: 2023-07-09 | Disposition: A | Discharge status: Home / Self Care | Payer: PPO | Attending: Emergency Medicine | Admitting: Emergency Medicine

## 2023-07-09 ENCOUNTER — Ambulatory Visit: Payer: Self-pay | Admitting: Internal Medicine

## 2023-07-09 DIAGNOSIS — R1013 Epigastric pain: Secondary | ICD-10-CM | POA: Insufficient documentation

## 2023-07-09 DIAGNOSIS — R079 Chest pain, unspecified: Secondary | ICD-10-CM | POA: Diagnosis not present

## 2023-07-09 DIAGNOSIS — M25512 Pain in left shoulder: Secondary | ICD-10-CM | POA: Diagnosis not present

## 2023-07-09 DIAGNOSIS — R140 Abdominal distension (gaseous): Secondary | ICD-10-CM | POA: Diagnosis not present

## 2023-07-09 DIAGNOSIS — R000 Tachycardia, unspecified: Secondary | ICD-10-CM | POA: Insufficient documentation

## 2023-07-09 LAB — CBC
HCT: 43.4 % (ref 36.0–46.0)
Hemoglobin: 14.8 g/dL (ref 12.0–15.0)
MCH: 31.7 pg (ref 26.0–34.0)
MCHC: 34.1 g/dL (ref 30.0–36.0)
MCV: 92.9 fL (ref 80.0–100.0)
Platelets: 259 10*3/uL (ref 150–400)
RBC: 4.67 MIL/uL (ref 3.87–5.11)
RDW: 12.7 % (ref 11.5–15.5)
WBC: 6.2 10*3/uL (ref 4.0–10.5)
nRBC: 0 % (ref 0.0–0.2)

## 2023-07-09 LAB — COMPREHENSIVE METABOLIC PANEL
ALT: 10 U/L (ref 0–44)
AST: 15 U/L (ref 15–41)
Albumin: 4.4 g/dL (ref 3.5–5.0)
Alkaline Phosphatase: 90 U/L (ref 38–126)
Anion gap: 7 (ref 5–15)
BUN: 12 mg/dL (ref 8–23)
CO2: 26 mmol/L (ref 22–32)
Calcium: 11.1 mg/dL — ABNORMAL HIGH (ref 8.9–10.3)
Chloride: 104 mmol/L (ref 98–111)
Creatinine, Ser: 0.83 mg/dL (ref 0.44–1.00)
GFR, Estimated: >60 mL/min (ref >60)
Glucose, Bld: 100 mg/dL — ABNORMAL HIGH (ref 70–99)
Potassium: 4.1 mmol/L (ref 3.5–5.1)
Sodium: 137 mmol/L (ref 135–145)
Total Bilirubin: 0.4 mg/dL (ref 0.0–1.2)
Total Protein: 7 g/dL (ref 6.5–8.1)

## 2023-07-09 LAB — TROPONIN I (HIGH SENSITIVITY)
Troponin I (High Sensitivity): <2 ng/L (ref <18)
Troponin I (High Sensitivity): <2 ng/L (ref <18)

## 2023-07-09 LAB — LIPASE, BLOOD: Lipase: 19 U/L (ref 11–51)

## 2023-07-09 MED ORDER — ALUM & MAG HYDROXIDE-SIMETH 200-200-20 MG/5ML PO SUSP
30.0000 mL | Freq: Once | ORAL | Status: AC
  Administered 2023-07-09: 30 mL via ORAL
  Filled 2023-07-09: qty 30

## 2023-07-09 MED ORDER — LIDOCAINE VISCOUS HCL 2 % MT SOLN
15.0000 mL | Freq: Once | OROMUCOSAL | Status: DC
  Filled 2023-07-09: qty 15

## 2023-07-09 NOTE — ED Provider Notes (Signed)
 [place] EMERGENCY DEPARTMENT AT [facility] Emergency Dept Provider Note   CSN: [id number] Arrival date & time: 07/09/23  1046     History  Tonya Cherry is a 73 y.o. female.  She presents with several recent episodes of epigastric pain, radiating to her back and left shoulder. The pain does not improve or worsen with eating. She reports similar previous episodes twice a year that self-resolved with heat pads and laying on her side.  Since the frequency of these episodes acutely increased this weekend she decided to come into the emergency department for evaluation.  She reports nausea but no vomiting.  She had 1 episode of diarrhea with light-colored stool on Friday, but her stool is now normal in consistency and color.  She denies any blood in her stool. She smokes ~ 1ppd. She thinks the pain is related to gallbladder attacks but says she came to this conclusion on her own and never had any imaging or workup for this. No NSAID or other OTC medication use. Has a known history of GERD but says this feels different. She denies any recent fevers, chills, illnesses, shortness of breath, unintentional weight loss, urinary issues, lightheadedness or dizziness.   Home Medications Prior to Admission medications   Medication Sig Start Date End Date Taking? Authorizing Provider  ALPRAZolam  (XANAX ) 0.5 MG tablet Take 1 tablet (0.5 mg total) by mouth 2 (two) times daily as needed. 07/02/23   Theophilus Andrews, Tully GRADE, MD  Cholecalciferol (VITAMIN D -3) 125 MCG (5000 UT) TABS Take by mouth.    [provider]  levothyroxine  (SYNTHROID ) 75 MCG tablet TAKE 1 TABLET BY MOUTH EVERY DAY 03/13/23   Theophilus Andrews, Tully GRADE, MD  sertraline  (ZOLOFT ) 50 MG tablet Take 1 tablet (50 mg total) by mouth daily. Patient not taking: Reported on 12/21/2022 09/13/21   Theophilus Andrews, Tully GRADE, MD  simvastatin  (ZOCOR ) 20 MG tablet Take 1 tablet (20 mg total) by mouth daily at 6 PM. 01/10/23   Theophilus Andrews,  Tully GRADE, MD  triamcinolone  cream (KENALOG ) 0.1 % APPLY TO AFFECTED AREA TWICE A DAY 03/07/22   Theophilus Andrews, Tully GRADE, MD  vitamin B-12 (CYANOCOBALAMIN ) 1000 MCG tablet Take 1,000 mcg by mouth daily.    [provider]      Allergies    Penicillins cross reactors, Sulfa antibiotics, and Other    Review of Systems   Review of Systems  Constitutional: Negative.   HENT: Negative.    Eyes: Negative.   Respiratory: Negative.    Cardiovascular: Negative.   Gastrointestinal:  Positive for abdominal pain and nausea. Negative for blood in stool, constipation, diarrhea and vomiting.  Endocrine: Negative.   Genitourinary: Negative.   Musculoskeletal: Negative.   Skin: Negative.   Allergic/Immunologic: Negative.   Neurological: Negative.   Hematological: Negative.   Psychiatric/Behavioral: Negative.      Physical Exam Updated Vital Signs BP (!) 105/51   Pulse 81   Temp 98.3 F (36.8 C)   Resp (!) 25   SpO2 94%  Physical Exam Constitutional:      Appearance: Normal appearance.  Cardiovascular:     Rate and Rhythm: Regular rhythm. Tachycardia present.     Pulses: Normal pulses.     Heart sounds: Normal heart sounds.  Pulmonary:     Effort: Pulmonary effort is normal.     Breath sounds: Normal breath sounds.  Abdominal:     General: Abdomen is flat. There is no distension.     Palpations: Abdomen is soft.  There is no mass.     Tenderness: There is no abdominal tenderness. There is no guarding or rebound.     Comments: Very mild epigastric tenderness to deep palpation  Neurological:     General: No focal deficit present.     Mental Status: She is alert and oriented to person, place, and time.    ED Results / Procedures / Treatments   Labs (all labs ordered are listed, but only abnormal results are displayed) Labs Reviewed  COMPREHENSIVE METABOLIC PANEL - Abnormal; Notable for the following components:      Result Value   Glucose, Bld 100 (*)    Calcium  11.1  (*)    All other components within normal limits  CBC  LIPASE, BLOOD  TROPONIN I (HIGH SENSITIVITY)  TROPONIN I (HIGH SENSITIVITY)   EKG EKG Interpretation Date/Time:  Monday July 09 2023 12:17:26 EST Ventricular Rate:  87 PR Interval:  167 QRS Duration:  100 QT Interval:  356 QTC Calculation: 429 R Axis:   50  Text Interpretation: Sinus rhythm Low voltage, precordial leads Minimal ST depression, inferior leads No significant change since last tracing Confirmed by Emil Share [phone]) on 07/09/2023 12:26:03 PM  Radiology DG Chest Port 1 View Result Date: 07/09/2023 CLINICAL DATA:  72 year old female with chest pain. Gallbladder attacks. Smoker. EXAM: PORTABLE CHEST 1 VIEW COMPARISON:  Chest radiographs 07/10/2010 and earlier. FINDINGS: Portable AP upright view at 1147 hours. Stable lung volumes. Normal cardiac size and mediastinal contours. Visualized tracheal air column is within normal limits. Symmetric increased pulmonary interstitium, chronic and likely smoking related. Otherwise when allowing for portable technique the lungs are clear. No pneumothorax. No pneumoperitoneum. Paucity of bowel gas. No acute osseous abnormality identified. IMPRESSION: No acute cardiopulmonary abnormality. Electronically Signed   By: VEAR Hurst M.D.   On: 07/09/2023 12:35    Medications Ordered in ED Medications  alum & mag hydroxide-simeth (MAALOX/MYLANTA) 200-200-20 MG/5ML suspension 30 mL (30 mLs Oral Given 07/09/23 1203)    And  lidocaine  (XYLOCAINE ) 2 % viscous mouth solution 15 mL (0 mLs Oral Hold 07/09/23 1204)   ED Course/ Medical Decision Making/ A&P   {   Medical Decision Making Amount and/or Complexity of Data Reviewed Labs: ordered. Radiology: ordered.  Risk OTC drugs. Prescription drug management.   This patient is presenting with intermittent nausea and epigastric pain that radiates to her back left shoulder.  Differential includes biliary colic, PUD, pancreatitis, musculoskeletal  pain, angina, dyspepsia, GERD.  She is not having symptoms currently except mild nausea.  I have ordered labs including a CBC, CMP, lipase, and troponins. I have also ordered an EKG and CXR.   Gave her maalox for indigestion with improvement of symptoms.   CBC normal, no leukocytosis. CMP normal, lipase 19, Alk phos 90, AST/ALT 15/10, less concern for pancreatitis or acute hepatobiliary pathology.    Troponin <2. EKG interpreted independently, shows normal sinus, regular rate, no ischemic changes.   Chest x-ray does not appear to have any consolidations, pneumothorax, or other acute abnormalities.  Given normal labs, physical exam, and imaging, low concern for ACS, cholecystitis, pancreatitis. Vitals are stable and she is tolerating PO intake. Asymptomatic on re-examination. Her intermittent symptoms are most likely secondary to biliary colic vs peptic ulcer disease vs msk pain. She can follow up with her PCP for further workup if her symptoms persist.   Final Clinical Impression(s) / ED Diagnoses Final diagnoses:  None   Rx / DC Orders ED Discharge Orders  None         Gregary Sharper, MD 07/09/23 1439    Emil Share, DO 07/09/23 1458

## 2023-07-09 NOTE — Telephone Encounter (Signed)
  Chief Complaint: mid-back and mid UQ abd pain Symptoms: pain Frequency: 2 days Pertinent Negatives: Patient denies fever, denies N/V,   Disposition: [x] ED /[] Urgent Care (no appt availability in office) / [] Appointment(In office/virtual)/ []  [facility]/ [] Home Care/ [] Refused Recommended Disposition /[] [facility]/ []  Follow-up with PCP  Additional Notes: Unsure if she will go to ED as the pain comes and goes and the pt states I hate the emergency room.  Pt was already scheduled for Wed from previous call. Pt states that she has a heating pad applied to the area and it helps.  Pt states she still has her appendix and gallbladder. Pt advised of risk of not going to the ED, pt understands.    Copied from CRM [id number]. Topic: Clinical - Red Word Triage >> Jul 09, 2023  9:53 AM Eleanor C wrote: [facility] that prompted transfer to Nurse Triage: patient thinks her gallbladder is causing her a lot of pain, spoke to another agent previously and made and appointment for a few days from now but now but called back because of the amount of pain Reason for Disposition  [1] SEVERE pain (e.g., excruciating) AND [2] present > 1 hour  Answer Assessment - Initial Assessment Questions 1. LOCATION: Where does it hurt?      Middle of breast bone, middle of back 2. RADIATION: Does the pain shoot anywhere else? (e.g., chest, back)     Radiates to back  3. ONSET: When did the pain begin? (e.g., minutes, hours or days ago)      2 days 4. SUDDEN: Gradual or sudden onset?     sudden 5. PATTERN Does the pain come and go, or is it constant?    - If it comes and goes: How long does it last? Do you have pain now?     (Note: Comes and goes means the pain is intermittent. It goes away completely between bouts.)    - If constant: Is it getting better, staying the same, or getting worse?      (Note: Constant means the pain never goes away completely; most serious pain is constant  and gets worse.)      intermittent 6. SEVERITY: How bad is the pain?  (e.g., Scale 1-10; mild, moderate, or severe)    - MILD (1-3): Doesn't interfere with normal activities, abdomen soft and not tender to touch.     - MODERATE (4-7): Interferes with normal activities or awakens from sleep, abdomen tender to touch.     - SEVERE (8-10): Excruciating pain, doubled over, unable to do any normal activities.       6 7. RECURRENT SYMPTOM: Have you ever had this type of stomach pain before? If Yes, ask: When was the last time? and What happened that time?      Yes, however, never seen for it 8. CAUSE: What do you think is causing the stomach pain?     gallbladder 9. RELIEVING/AGGRAVATING FACTORS: What makes it better or worse? (e.g., antacids, bending or twisting motion, bowel movement)     denies 10. OTHER SYMPTOMS: Do you have any other symptoms? (e.g., back pain, diarrhea, fever, urination pain, vomiting)       Diarrhea before pain started  Protocols used: Abdominal Pain - [facility]

## 2023-07-09 NOTE — Discharge Instructions (Signed)
 Follow up with your family doc in the office.  Return for worsening pain, fever, inability to eat or drink.  Return if you symptoms worsen with exercise or going up stairs.   Try pepcid or tagamet up to twice a day.  Try to avoid things that may make this worse, most commonly these are spicy foods tomato based products fatty foods chocolate and peppermint.  Alcohol and tobacco can also make this worse.  Return to the emergency department for sudden worsening pain fever or inability to eat or drink.

## 2023-07-09 NOTE — ED Triage Notes (Signed)
 Pt c/o what I think is gallbladder attacks this weekend, advised by PCP to come to ED for eval. States she has hx of same, once, maybe twice a year, but this time it's more frequent. Associated nausea  Follow up appt Wednesday, unable to get in sooner.

## 2023-07-11 ENCOUNTER — Encounter: Payer: Self-pay | Admitting: Internal Medicine

## 2023-07-11 ENCOUNTER — Ambulatory Visit (INDEPENDENT_AMBULATORY_CARE_PROVIDER_SITE_OTHER): Payer: PPO | Admitting: Internal Medicine

## 2023-07-11 VITALS — BP 110/78 | HR 94 | Temp 98.1°F | Wt 146.5 lb

## 2023-07-11 DIAGNOSIS — R1013 Epigastric pain: Secondary | ICD-10-CM

## 2023-07-11 MED ORDER — PANTOPRAZOLE SODIUM 40 MG PO TBEC: 40.0000 mg | DELAYED_RELEASE_TABLET | Freq: Two times a day (BID) | ORAL | 2 refills | Status: AC

## 2023-07-11 NOTE — Progress Notes (Signed)
 Established Patient Office Visit     CC/Reason for Visit: ED follow-up, epigastric pain  HPI: Tonya Cherry is a 73 y.o. female who is coming in today for the above mentioned reasons.  She went to the emergency department on January 13.  Her pain started 2 days prior.  She describes it as severe 10 out of 10, intermittent lasting no more than 10 minutes at a time, gnawing in quality.  Not aggravated by food.  She has had some diarrhea.  She has had GERD in the past but has been off PPI therapy for years.  She is a chronic smoker, does not drink alcohol, no excessive NSAID use.  She noticed 1 dark stool over the weekend.  Of note her son has had an ongoing diarrheal illness over the weekend as well.  In the ED she was ruled out for ACS with negative troponin and EKG.  Chest x-ray without acute abnormalities.  Labs were significant for no anemia and no LFT elevation.  She has had ongoing hypercalcemia and is scheduled to see endocrinology for hyperparathyroidism.   Past Medical/Surgical History: Past Medical History:  Diagnosis Date   Anxiety    COPD (chronic obstructive pulmonary disease) (HCC)    Depression    GERD (gastroesophageal reflux disease)    Hyperlipidemia    Osteoporosis    Thyroid  disease    Ulcer     Past Surgical History:  Procedure Laterality Date   CESAREAN SECTION      Social History:  reports that she has been smoking cigarettes. She has never used smokeless tobacco. She reports that she does not drink alcohol and does not use drugs.  Allergies: Allergies  Allergen Reactions   Penicillins Cross Reactors Rash   Sulfa Antibiotics Rash   Other Diarrhea    "mycin"     Family History:  Family History  Problem Relation Age of Onset   Heart disease Mother    Stroke Mother    Alcohol abuse Father    Prostate cancer Father    Colon cancer Maternal Grandmother    Colon polyps Brother    Breast cancer Neg Hx    Esophageal cancer Neg Hx    Rectal  cancer Neg Hx    Stomach cancer Neg Hx    Hypercalcemia Neg Hx    Pancreatic cancer Neg Hx      Current Outpatient Medications:    ALPRAZolam  (XANAX ) 0.5 MG tablet, Take 1 tablet (0.5 mg total) by mouth 2 (two) times daily as needed., Disp: 60 tablet, Rfl: 0   Cholecalciferol (VITAMIN D -3) 125 MCG (5000 UT) TABS, Take by mouth., Disp: , Rfl:    levothyroxine  (SYNTHROID ) 75 MCG tablet, TAKE 1 TABLET BY MOUTH EVERY DAY, Disp: 90 tablet, Rfl: 1   pantoprazole  (PROTONIX ) 40 MG tablet, Take 1 tablet (40 mg total) by mouth 2 (two) times daily., Disp: 60 tablet, Rfl: 2   triamcinolone  cream (KENALOG ) 0.1 %, APPLY TO AFFECTED AREA TWICE A DAY, Disp: 30 g, Rfl: 1   vitamin B-12 (CYANOCOBALAMIN ) 1000 MCG tablet, Take 1,000 mcg by mouth daily., Disp: , Rfl:   Review of Systems:  Negative unless indicated in HPI.   Physical Exam: Vitals:   07/11/23 1603  BP: 110/78  Pulse: 94  Temp: 98.1 F (36.7 C)  TempSrc: Oral  SpO2: 98%  Weight: 146 lb 8 oz (66.5 kg)    Body mass index is 26.37 kg/m.   Physical Exam Abdominal:  General: Abdomen is flat. Bowel sounds are normal.     Tenderness: There is no abdominal tenderness.      Impression and Plan:  Epigastric pain -     Pantoprazole  Sodium; Take 1 tablet (40 mg total) by mouth 2 (two) times daily.  Dispense: 60 tablet; Refill: 2 -     Ambulatory referral to Gastroenterology   [facility] charts reviewed in detail. -Differential diagnosis includes GERD/peptic ulcer disease/ongoing norovirus or other enteritis/gallbladder pathology.  Given history and characteristics of pain I am leaning towards peptic ulcer disease.  She also had 1 episode of dark-colored stools.  Have advised twice daily PPI, urgent GI referral for consideration of endoscopy.  Time spent:30 minutes reviewing chart, interviewing and examining patient and formulating plan of care.     Marguerita Shih, MD [place] [facility]

## 2023-07-25 ENCOUNTER — Ambulatory Visit (INDEPENDENT_AMBULATORY_CARE_PROVIDER_SITE_OTHER): Payer: PPO | Admitting: Internal Medicine

## 2023-07-25 ENCOUNTER — Other Ambulatory Visit: Payer: Self-pay | Admitting: Internal Medicine

## 2023-07-25 ENCOUNTER — Encounter: Payer: Self-pay | Admitting: Internal Medicine

## 2023-07-25 VITALS — BP 120/84 | HR 100 | Temp 98.3°F | Wt 148.4 lb

## 2023-07-25 DIAGNOSIS — R1013 Epigastric pain: Secondary | ICD-10-CM | POA: Diagnosis not present

## 2023-07-25 LAB — CBC WITH DIFFERENTIAL/PLATELET
Basophils Absolute: 0.1 K/uL (ref 0.0–0.1)
Basophils Relative: 0.9 % (ref 0.0–3.0)
Eosinophils Absolute: 0.3 K/uL (ref 0.0–0.7)
Eosinophils Relative: 5.3 % — ABNORMAL HIGH (ref 0.0–5.0)
HCT: 43.8 % (ref 36.0–46.0)
Hemoglobin: 14.7 g/dL (ref 12.0–15.0)
Lymphocytes Relative: 34.7 % (ref 12.0–46.0)
Lymphs Abs: 2.2 K/uL (ref 0.7–4.0)
MCHC: 33.4 g/dL (ref 30.0–36.0)
MCV: 96.6 fl (ref 78.0–100.0)
Monocytes Absolute: 0.6 K/uL (ref 0.1–1.0)
Monocytes Relative: 9.1 % (ref 3.0–12.0)
Neutro Abs: 3.2 K/uL (ref 1.4–7.7)
Neutrophils Relative %: 50 % (ref 43.0–77.0)
Platelets: 264 K/uL (ref 150.0–400.0)
RBC: 4.54 Mil/uL (ref 3.87–5.11)
RDW: 13.4 % (ref 11.5–15.5)
WBC: 6.5 K/uL (ref 4.0–10.5)

## 2023-07-25 LAB — COMPREHENSIVE METABOLIC PANEL WITH GFR
ALT: 15 U/L (ref 0–35)
AST: 17 U/L (ref 0–37)
Albumin: 4.4 g/dL (ref 3.5–5.2)
Alkaline Phosphatase: 97 U/L (ref 39–117)
BUN: 11 mg/dL (ref 6–23)
CO2: 29 meq/L (ref 19–32)
Calcium: 11 mg/dL — ABNORMAL HIGH (ref 8.4–10.5)
Chloride: 104 meq/L (ref 96–112)
Creatinine, Ser: 0.9 mg/dL (ref 0.40–1.20)
GFR: 64.37 mL/min
Glucose, Bld: 89 mg/dL (ref 70–99)
Potassium: 4.7 meq/L (ref 3.5–5.1)
Sodium: 140 meq/L (ref 135–145)
Total Bilirubin: 0.5 mg/dL (ref 0.2–1.2)
Total Protein: 7.2 g/dL (ref 6.0–8.3)

## 2023-07-25 LAB — LIPASE: Lipase: 14 U/L (ref 11.0–59.0)

## 2023-07-25 NOTE — Progress Notes (Signed)
Established Patient Office Visit     CC/Reason for Visit: Continued epigastric pain  HPI: Tonya Cherry is a 73 y.o. female who is coming in today for the above mentioned reasons.  I first saw her for this on January 15 following urgent care visit.  Please see that note for further details.  She continues to have significant epigastric and left upper quadrant pain.  She is taking PPI as prescribed.  She has had no more dark/melanotic stools.  No icteric tint to skin, no vomiting although she has been somewhat nauseous.  No weight loss.   Past Medical/Surgical History: Past Medical History:  Diagnosis Date   Anxiety    COPD (chronic obstructive pulmonary disease) (HCC)    Depression    GERD (gastroesophageal reflux disease)    Hyperlipidemia    Osteoporosis    Thyroid disease    Ulcer     Past Surgical History:  Procedure Laterality Date   CESAREAN SECTION      Social History:  reports that she has been smoking cigarettes. She has never used smokeless tobacco. She reports that she does not drink alcohol and does not use drugs.  Allergies: Allergies  Allergen Reactions   Penicillins Cross Reactors Rash   Sulfa Antibiotics Rash   Other Diarrhea    "mycin"     Family History:  Family History  Problem Relation Age of Onset   Heart disease Mother    Stroke Mother    Alcohol abuse Father    Prostate cancer Father    Colon cancer Maternal Grandmother    Colon polyps Brother    Breast cancer Neg Hx    Esophageal cancer Neg Hx    Rectal cancer Neg Hx    Stomach cancer Neg Hx    Hypercalcemia Neg Hx    Pancreatic cancer Neg Hx      Current Outpatient Medications:    ALPRAZolam (XANAX) 0.5 MG tablet, Take 1 tablet (0.5 mg total) by mouth 2 (two) times daily as needed., Disp: 60 tablet, Rfl: 0   Cholecalciferol (VITAMIN D-3) 125 MCG (5000 UT) TABS, Take by mouth., Disp: , Rfl:    levothyroxine (SYNTHROID) 75 MCG tablet, TAKE 1 TABLET BY MOUTH EVERY DAY,  Disp: 90 tablet, Rfl: 1   pantoprazole (PROTONIX) 40 MG tablet, Take 1 tablet (40 mg total) by mouth 2 (two) times daily., Disp: 60 tablet, Rfl: 2   triamcinolone cream (KENALOG) 0.1 %, APPLY TO AFFECTED AREA TWICE A DAY, Disp: 30 g, Rfl: 1   vitamin B-12 (CYANOCOBALAMIN) 1000 MCG tablet, Take 1,000 mcg by mouth daily., Disp: , Rfl:   Review of Systems:  Negative unless indicated in HPI.   Physical Exam: Vitals:   07/25/23 0848  BP: 120/84  Pulse: 100  Temp: 98.3 F (36.8 C)  TempSrc: Oral  SpO2: 98%  Weight: 148 lb 6.4 oz (67.3 kg)    Body mass index is 26.71 kg/m.   Physical Exam Abdominal:     General: Abdomen is flat. Bowel sounds are normal. There is no distension.     Palpations: Abdomen is soft. There is no hepatomegaly or splenomegaly.     Tenderness: There is abdominal tenderness in the epigastric area.      Impression and Plan:  Epigastric pain -     CBC with Differential/Platelet; Future -     Comprehensive metabolic panel; Future -     Lipase; Future   -Continue to be concerned about GERD/peptic ulcer  disease, also possibly pancreatitis.  Based on exam today it seems less likely to be gallbladder related. -Check labs today including CBC, CMP and lipase.  Low threshold to order CT abdomen and pelvis. -Placed urgent referral to GI on 1/15.  Still contact has not been made with patient to schedule.  Will try and expedite.  Time spent:31 minutes reviewing chart, interviewing and examining patient and formulating plan of care.     Chaya Jan, MD [place] [facility] at [facility]

## 2023-07-30 ENCOUNTER — Other Ambulatory Visit: Payer: Self-pay | Admitting: Internal Medicine

## 2023-07-30 DIAGNOSIS — F419 Anxiety disorder, unspecified: Secondary | ICD-10-CM

## 2023-08-03 DIAGNOSIS — M810 Age-related osteoporosis without current pathological fracture: Secondary | ICD-10-CM | POA: Diagnosis not present

## 2023-08-03 DIAGNOSIS — E039 Hypothyroidism, unspecified: Secondary | ICD-10-CM | POA: Diagnosis not present

## 2023-08-03 DIAGNOSIS — E559 Vitamin D deficiency, unspecified: Secondary | ICD-10-CM | POA: Diagnosis not present

## 2023-08-03 DIAGNOSIS — E210 Primary hyperparathyroidism: Secondary | ICD-10-CM | POA: Diagnosis not present

## 2023-08-10 DIAGNOSIS — M810 Age-related osteoporosis without current pathological fracture: Secondary | ICD-10-CM | POA: Diagnosis not present

## 2023-08-10 DIAGNOSIS — E039 Hypothyroidism, unspecified: Secondary | ICD-10-CM | POA: Diagnosis not present

## 2023-08-10 DIAGNOSIS — E210 Primary hyperparathyroidism: Secondary | ICD-10-CM | POA: Diagnosis not present

## 2023-08-10 DIAGNOSIS — E559 Vitamin D deficiency, unspecified: Secondary | ICD-10-CM | POA: Diagnosis not present

## 2023-08-17 ENCOUNTER — Ambulatory Visit (HOSPITAL_BASED_OUTPATIENT_CLINIC_OR_DEPARTMENT_OTHER): Payer: PPO

## 2023-08-27 ENCOUNTER — Other Ambulatory Visit: Payer: Self-pay | Admitting: Internal Medicine

## 2023-08-27 DIAGNOSIS — F419 Anxiety disorder, unspecified: Secondary | ICD-10-CM

## 2023-08-31 ENCOUNTER — Ambulatory Visit: Admitting: Urgent Care

## 2023-08-31 ENCOUNTER — Encounter: Payer: Self-pay | Admitting: Urgent Care

## 2023-08-31 VITALS — BP 142/81 | HR 94 | Temp 98.1°F | Wt 148.8 lb

## 2023-08-31 DIAGNOSIS — N3000 Acute cystitis without hematuria: Secondary | ICD-10-CM

## 2023-08-31 LAB — POC URINALSYSI DIPSTICK (AUTOMATED)
Bilirubin, UA: NEGATIVE
Glucose, UA: NEGATIVE
Ketones, UA: NEGATIVE
Nitrite, UA: POSITIVE
Protein, UA: POSITIVE — AB
Spec Grav, UA: 1.015 (ref 1.010–1.025)
Urobilinogen, UA: 0.2 U/dL
pH, UA: 6 (ref 5.0–8.0)

## 2023-08-31 MED ORDER — NITROFURANTOIN MONOHYD MACRO 100 MG PO CAPS: 100.0000 mg | ORAL_CAPSULE | Freq: Two times a day (BID) | ORAL | 0 refills | Status: AC

## 2023-08-31 NOTE — Progress Notes (Signed)
 Established Patient Office Visit  Subjective:  Patient ID: Tonya Cherry, female    DOB: [birth year]  Age: 73 y.o. MRN: [id number]  Chief Complaint  Patient presents with   Urinary Frequency    Pt has been having cloudy urine and foul smelling urine for about 3 days now. She has also been having urinary frequency.    Urinary Frequency  This is a new problem. The current episode started in the past 7 days (3 days ago). The problem occurs every urination. The problem has been gradually worsening. The quality of the pain is described as aching ("pressure"). There has been no fever. There is No history of pyelonephritis. Associated symptoms include frequency and urgency. Pertinent negatives include no chills, discharge, flank pain, hematuria, hesitancy, nausea, sweats or vomiting. She has tried increased fluids and acetaminophen for the symptoms. The treatment provided no relief. There is no history of recurrent UTIs (last UTI 30 years ago per pt).    Patient Active Problem List   Diagnosis Date Noted   Primary hyperparathyroidism (HCC) 01/10/2023   Hypercalcemia 09/03/2018   Vitamin D deficiency 05/31/2018   Osteoporosis 05/30/2018   Carotid artery stenosis 05/20/2012   Dyslipidemia 05/09/2012   Impaired glucose tolerance 05/09/2012   Tobacco abuse 05/09/2012   Hypothyroidism 01/05/2012   Past Medical History:  Diagnosis Date   Anxiety    COPD (chronic obstructive pulmonary disease) (HCC)    Depression    GERD (gastroesophageal reflux disease)    Hyperlipidemia    Osteoporosis    Thyroid disease    Ulcer    Past Surgical History:  Procedure Laterality Date   CESAREAN SECTION     Social History   Tobacco Use   Smoking status: Every Day    Current packs/day: 1.00    Types: Cigarettes   Smokeless tobacco: Never  Vaping Use   Vaping status: Never Used  Substance Use Topics   Alcohol use: No   Drug use: No      ROS: as noted in HPI  Objective:     BP (!)  142/81   Pulse 94   Temp 98.1 F (36.7 C) (Oral)   Wt 148 lb 12.8 oz (67.5 kg)   SpO2 97%   BMI 26.78 kg/m  BP Readings from Last 3 Encounters:  08/31/23 (!) 142/81  07/25/23 120/84  07/11/23 110/78   Wt Readings from Last 3 Encounters:  08/31/23 148 lb 12.8 oz (67.5 kg)  07/25/23 148 lb 6.4 oz (67.3 kg)  07/11/23 146 lb 8 oz (66.5 kg)      Physical Exam Vitals and nursing note reviewed.  Constitutional:      General: She is not in acute distress.    Appearance: Normal appearance. She is not ill-appearing, toxic-appearing or diaphoretic.  HENT:     Head: Normocephalic and atraumatic.  Eyes:     General:        Right eye: No discharge.        Left eye: No discharge.     Extraocular Movements: Extraocular movements intact.     Pupils: Pupils are equal, round, and reactive to light.  Cardiovascular:     Rate and Rhythm: Normal rate.  Pulmonary:     Effort: Pulmonary effort is normal. No respiratory distress.  Abdominal:     General: Abdomen is flat. There is no distension.     Palpations: Abdomen is soft.     Tenderness: There is no abdominal tenderness. There is no right CVA  tenderness, left CVA tenderness, guarding or rebound.  Skin:    General: Skin is warm and dry.     Coloration: Skin is not jaundiced.     Findings: No bruising, erythema or rash.  Neurological:     Mental Status: She is alert and oriented to person, place, and time.      Results for orders placed or performed in visit on 08/31/23  POCT Urinalysis Dipstick (Automated)  Result Value Ref Range   Color, UA yellow    Clarity, UA cloudy    Glucose, UA Negative Negative   Bilirubin, UA negative    Ketones, UA negative    Spec Grav, UA 1.015 1.010 - 1.025   Blood, UA 2+    pH, UA 6.0 5.0 - 8.0   Protein, UA Positive (A) Negative   Urobilinogen, UA 0.2 0.2 or 1.0 E.U./dL   Nitrite, UA positive    Leukocytes, UA Large (3+) (A) Negative    Last CBC Lab Results  Component Value Date   WBC  6.5 07/25/2023   HGB 14.7 07/25/2023   HCT 43.8 07/25/2023   MCV 96.6 07/25/2023   MCH 31.7 07/09/2023   RDW 13.4 07/25/2023   PLT 264.0 07/25/2023   Last metabolic panel Lab Results  Component Value Date   GLUCOSE 89 07/25/2023   NA 140 07/25/2023   K 4.7 07/25/2023   CL 104 07/25/2023   CO2 29 07/25/2023   BUN 11 07/25/2023   CREATININE 0.90 07/25/2023   GFR 64.37 07/25/2023   CALCIUM 11.0 (H) 07/25/2023   PROT 7.2 07/25/2023   ALBUMIN 4.4 07/25/2023   BILITOT 0.5 07/25/2023   ALKPHOS 97 07/25/2023   AST 17 07/25/2023   ALT 15 07/25/2023   ANIONGAP 7 07/09/2023      The 10-year ASCVD risk score (Arnett DK, et al., 2019) is: 21.1%  Assessment & Plan:  Acute cystitis without hematuria -     POCT Urinalysis Dipstick (Automated) -     Nitrofurantoin Monohyd Macro; Take 1 capsule (100 mg total) by mouth 2 (two) times daily for 5 days.  Dispense: 10 capsule; Refill: 0 -     Urine Culture  UA indicative of UTI. Pt denies red flag s/sx. Start macrobid BID x 5 days. Urine culture sent out. Offered pyridium, declined. Increased water intake recommended.   No follow-ups on file.   Maretta Bees, PA

## 2023-08-31 NOTE — Patient Instructions (Signed)
Your symptoms are consistent with a urinary tract infection. Start taking the antibiotic twice daily until completed. Increase your water intake. We will send out your urine culture and only call if a change in treatment is necessary. Monitor for any change in or worsening symptoms; fever, hematuria or flank pain would warrant a recheck  

## 2023-09-02 LAB — URINE CULTURE
MICRO NUMBER:: 16174664
SPECIMEN QUALITY:: ADEQUATE

## 2023-09-03 ENCOUNTER — Encounter: Payer: Self-pay | Admitting: Urgent Care

## 2023-09-15 ENCOUNTER — Other Ambulatory Visit: Payer: Self-pay | Admitting: Internal Medicine

## 2023-09-15 DIAGNOSIS — E039 Hypothyroidism, unspecified: Secondary | ICD-10-CM

## 2023-09-24 ENCOUNTER — Other Ambulatory Visit: Payer: Self-pay | Admitting: Internal Medicine

## 2023-09-24 DIAGNOSIS — F419 Anxiety disorder, unspecified: Secondary | ICD-10-CM

## 2023-10-22 ENCOUNTER — Other Ambulatory Visit: Payer: Self-pay | Admitting: Internal Medicine

## 2023-10-22 DIAGNOSIS — F419 Anxiety disorder, unspecified: Secondary | ICD-10-CM

## 2023-11-20 ENCOUNTER — Other Ambulatory Visit: Payer: Self-pay | Admitting: Internal Medicine

## 2023-11-20 DIAGNOSIS — F419 Anxiety disorder, unspecified: Secondary | ICD-10-CM

## 2023-12-19 ENCOUNTER — Other Ambulatory Visit: Payer: Self-pay | Admitting: Internal Medicine

## 2023-12-19 DIAGNOSIS — E039 Hypothyroidism, unspecified: Secondary | ICD-10-CM

## 2023-12-22 ENCOUNTER — Other Ambulatory Visit: Payer: Self-pay | Admitting: Internal Medicine

## 2023-12-22 DIAGNOSIS — F419 Anxiety disorder, unspecified: Secondary | ICD-10-CM

## 2024-01-22 ENCOUNTER — Other Ambulatory Visit: Payer: Self-pay | Admitting: Internal Medicine

## 2024-01-22 DIAGNOSIS — F419 Anxiety disorder, unspecified: Secondary | ICD-10-CM

## 2024-02-12 ENCOUNTER — Encounter (INDEPENDENT_AMBULATORY_CARE_PROVIDER_SITE_OTHER): Admitting: Family Medicine

## 2024-02-12 NOTE — Progress Notes (Signed)
 error

## 2024-02-17 ENCOUNTER — Other Ambulatory Visit: Payer: Self-pay | Admitting: Internal Medicine

## 2024-02-17 DIAGNOSIS — F419 Anxiety disorder, unspecified: Secondary | ICD-10-CM

## 2024-03-21 ENCOUNTER — Other Ambulatory Visit: Payer: Self-pay | Admitting: Internal Medicine

## 2024-03-21 DIAGNOSIS — E039 Hypothyroidism, unspecified: Secondary | ICD-10-CM

## 2024-03-24 ENCOUNTER — Other Ambulatory Visit: Payer: Self-pay | Admitting: Internal Medicine

## 2024-03-24 ENCOUNTER — Ambulatory Visit: Admitting: Family Medicine

## 2024-03-24 ENCOUNTER — Encounter: Payer: Self-pay | Admitting: Family Medicine

## 2024-03-24 VITALS — BP 124/82 | HR 88 | Temp 98.5°F | Ht 62.5 in | Wt 149.0 lb

## 2024-03-24 DIAGNOSIS — R051 Acute cough: Secondary | ICD-10-CM

## 2024-03-24 DIAGNOSIS — F419 Anxiety disorder, unspecified: Secondary | ICD-10-CM

## 2024-03-24 DIAGNOSIS — J069 Acute upper respiratory infection, unspecified: Secondary | ICD-10-CM | POA: Diagnosis not present

## 2024-03-24 LAB — POCT INFLUENZA A/B
Influenza A, POC: NEGATIVE
Influenza B, POC: NEGATIVE

## 2024-03-24 LAB — POC COVID19 BINAXNOW: SARS Coronavirus 2 Ag: NEGATIVE

## 2024-03-24 MED ORDER — ALBUTEROL SULFATE HFA 108 (90 BASE) MCG/ACT IN AERS: 2.0000 | INHALATION_SPRAY | Freq: Four times a day (QID) | RESPIRATORY_TRACT | 2 refills | Status: AC | PRN

## 2024-03-24 NOTE — Patient Instructions (Addendum)
-  It was a pleasure to care for you today and I hope you get to feeling better soon! -Rapid covid and influenza are negative.  -Through shared decision making decided to continue taking [facility] Adult Cough, Chest Congestion DM consistently as directed, along with Tylenol or Ibuprofen as needed.  -Prescribed Albuterol Inhaler, 2 puffs every 6 hours as needed for cough, shortness of breath, or wheezing.  -Rest, hydrate, and take deep breaths frequently throughout the day.  -May use Vics vapor rub on chest.  -If not improved or symptoms become worse, please send a MyChart message. Will send in Prednisone and possibly antibiotic. Discussed about these medications.

## 2024-03-24 NOTE — Progress Notes (Signed)
 Established Patient Office Visit   Subjective:  Patient ID: Tonya Cherry, female    DOB: Dec 02, 1950  Age: 73 y.o. MRN: [id number]  Chief Complaint  Patient presents with   Cough   Nasal Congestion    HPI:  Patient is complaining of the following symptoms: Sore throat-started first, resolved a couple of days Productive cough-if I make it,  Nasal congestion  Nasal drainage-clear SHOB-intermittent, mild  Body aches-mostly arms, but resolved  Denies headache, dizziness lightheadedness, ear pain or drainage, CP, fever, loss of appetite, nausea, vomiting.  Symptoms initially started last Wednesday.   Patient has been taking [facility] Adult Cough, Chest Congestion DM periodically and Tylenol. Some relief.  Review of Systems  Respiratory:  Positive for cough.    See HPI above     Objective:   BP 124/82   Pulse 88   Temp 98.5 F (36.9 C) (Oral)   Ht 5' 2.5 (1.588 m)   Wt 149 lb (67.6 kg)   SpO2 96%   BMI 26.82 kg/m     Physical Exam Vitals reviewed.  Constitutional:      General: She is not in acute distress.    Appearance: Normal appearance. She is ill-appearing (Mild). She is not toxic-appearing or diaphoretic.  HENT:     Head: Normocephalic and atraumatic.     Right Ear: Tympanic membrane, ear canal and external ear normal. There is no impacted cerumen.     Left Ear: Tympanic membrane, ear canal and external ear normal. There is no impacted cerumen.     Nose: Congestion present.     Right Sinus: No maxillary sinus tenderness or frontal sinus tenderness.     Left Sinus: No maxillary sinus tenderness or frontal sinus tenderness.     Mouth/Throat:     Pharynx: Oropharynx is clear. Uvula midline. No pharyngeal swelling, oropharyngeal exudate, posterior oropharyngeal erythema, uvula swelling or postnasal drip.  Eyes:     General:        Right eye: No discharge.        Left eye: No discharge.     Conjunctiva/sclera: Conjunctivae normal.  Cardiovascular:     Rate  and Rhythm: Normal rate and regular rhythm.     Heart sounds: Normal heart sounds. No murmur heard.    No friction rub. No gallop.  Pulmonary:     Effort: Pulmonary effort is normal. No respiratory distress.     Breath sounds: Wheezing (Mild, left upper posterior) present.  Musculoskeletal:        General: Normal range of motion.  Lymphadenopathy:     Head:     Right side of head: No submental or submandibular adenopathy.     Left side of head: No submental or submandibular adenopathy.  Skin:    General: Skin is warm and dry.  Neurological:     General: No focal deficit present.     Mental Status: She is alert and oriented to person, place, and time. Mental status is at baseline.  Psychiatric:        Mood and Affect: Mood normal.        Behavior: Behavior normal.        Thought Content: Thought content normal.        Judgment: Judgment normal.     Latest Reference Range & Units 03/24/24 15:12  Influenza A, POC Negative  Negative  Influenza B, POC Negative  Negative  SARS Coronavirus 2 Ag Negative  Negative     Assessment & Plan:  Viral upper respiratory tract infection  Acute cough -     POCT Influenza A/B -     POC COVID-19 BinaxNow -     Albuterol Sulfate HFA; Inhale 2 puffs into the lungs every 6 (six) hours as needed for wheezing or shortness of breath (or cough).  Dispense: 8 g; Refill: 2  -Rapid covid and influenza are negative.  -Through shared decision making decided to continue taking [facility] Adult Cough, Chest Congestion DM consistently as directed, along with Tylenol or Ibuprofen as needed.  -Prescribed Albuterol Inhaler, 2 puffs every 6 hours as needed for cough, shortness of breath, or wheezing.  -Rest, hydrate, and take deep breaths frequently throughout the day.  -May use Vics vapor rub on chest.  -If not improved or symptoms become worse, please send a MyChart message. Will send in Prednisone and possibly antibiotic. Discussed about these medications.  Jalesa Thien, NP

## 2024-04-11 DIAGNOSIS — E210 Primary hyperparathyroidism: Secondary | ICD-10-CM | POA: Diagnosis not present

## 2024-04-11 DIAGNOSIS — M810 Age-related osteoporosis without current pathological fracture: Secondary | ICD-10-CM | POA: Diagnosis not present

## 2024-04-16 DIAGNOSIS — M810 Age-related osteoporosis without current pathological fracture: Secondary | ICD-10-CM | POA: Diagnosis not present

## 2024-04-16 DIAGNOSIS — E039 Hypothyroidism, unspecified: Secondary | ICD-10-CM | POA: Diagnosis not present

## 2024-04-16 DIAGNOSIS — E559 Vitamin D deficiency, unspecified: Secondary | ICD-10-CM | POA: Diagnosis not present

## 2024-04-16 DIAGNOSIS — E210 Primary hyperparathyroidism: Secondary | ICD-10-CM | POA: Diagnosis not present

## 2024-04-17 ENCOUNTER — Other Ambulatory Visit (HOSPITAL_BASED_OUTPATIENT_CLINIC_OR_DEPARTMENT_OTHER): Payer: Self-pay | Admitting: Nurse Practitioner

## 2024-04-17 DIAGNOSIS — E039 Hypothyroidism, unspecified: Secondary | ICD-10-CM

## 2024-04-21 ENCOUNTER — Other Ambulatory Visit: Payer: Self-pay | Admitting: Internal Medicine

## 2024-04-21 DIAGNOSIS — F419 Anxiety disorder, unspecified: Secondary | ICD-10-CM

## 2024-04-24 DIAGNOSIS — D1801 Hemangioma of skin and subcutaneous tissue: Secondary | ICD-10-CM | POA: Diagnosis not present

## 2024-04-24 DIAGNOSIS — Z85828 Personal history of other malignant neoplasm of skin: Secondary | ICD-10-CM | POA: Diagnosis not present

## 2024-04-24 DIAGNOSIS — Z08 Encounter for follow-up examination after completed treatment for malignant neoplasm: Secondary | ICD-10-CM | POA: Diagnosis not present

## 2024-04-24 DIAGNOSIS — L821 Other seborrheic keratosis: Secondary | ICD-10-CM | POA: Diagnosis not present

## 2024-04-24 DIAGNOSIS — L814 Other melanin hyperpigmentation: Secondary | ICD-10-CM | POA: Diagnosis not present

## 2024-05-19 ENCOUNTER — Ambulatory Visit (INDEPENDENT_AMBULATORY_CARE_PROVIDER_SITE_OTHER): Admitting: Internal Medicine

## 2024-05-19 ENCOUNTER — Ambulatory Visit: Payer: Self-pay | Admitting: *Deleted

## 2024-05-19 ENCOUNTER — Other Ambulatory Visit: Payer: Self-pay | Admitting: Internal Medicine

## 2024-05-19 ENCOUNTER — Encounter: Payer: Self-pay | Admitting: Internal Medicine

## 2024-05-19 VITALS — BP 124/80 | HR 105 | Temp 98.3°F | Wt 152.0 lb

## 2024-05-19 DIAGNOSIS — S00262A Insect bite (nonvenomous) of left eyelid and periocular area, initial encounter: Secondary | ICD-10-CM | POA: Diagnosis not present

## 2024-05-19 DIAGNOSIS — W57XXXA Bitten or stung by nonvenomous insect and other nonvenomous arthropods, initial encounter: Secondary | ICD-10-CM | POA: Diagnosis not present

## 2024-05-19 DIAGNOSIS — F419 Anxiety disorder, unspecified: Secondary | ICD-10-CM

## 2024-05-19 NOTE — Progress Notes (Signed)
 Established Patient Office Visit     CC/Reason for Visit: Tick bite of left upper eyelid  HPI: Tonya Cherry is a 73 y.o. female who is coming in today for the above mentioned reasons.  Saturday evening she went to a tree lighting ceremony.  On Sunday morning she noticed some pain and swelling as well as a black dot to her left upper eyelid.  She quickly realized it was a tick and had her son remove it with tweezers.  Here today to follow-up.   Past Medical/Surgical History: Past Medical History:  Diagnosis Date   Anxiety    COPD (chronic obstructive pulmonary disease) (HCC)    Depression    GERD (gastroesophageal reflux disease)    Hyperlipidemia    Osteoporosis    Thyroid  disease    Ulcer     Past Surgical History:  Procedure Laterality Date   CESAREAN SECTION      Social History:  reports that she has been smoking cigarettes. She has never used smokeless tobacco. She reports that she does not drink alcohol and does not use drugs.  Allergies: Allergies  Allergen Reactions   Penicillins Cross Reactors Rash   Sulfa Antibiotics Rash   Other Diarrhea    mycin     Family History:  Family History  Problem Relation Age of Onset   Heart disease Mother    Stroke Mother    Alcohol abuse Father    Prostate cancer Father    Colon cancer Maternal Grandmother    Colon polyps Brother    Breast cancer Neg Hx    Esophageal cancer Neg Hx    Rectal cancer Neg Hx    Stomach cancer Neg Hx    Hypercalcemia Neg Hx    Pancreatic cancer Neg Hx      Current Outpatient Medications:    albuterol  (VENTOLIN  HFA) 108 (90 Base) MCG/ACT inhaler, Inhale 2 puffs into the lungs every 6 (six) hours as needed for wheezing or shortness of breath (or cough)., Disp: 8 g, Rfl: 2   ALPRAZolam  (XANAX ) 0.5 MG tablet, TAKE 1 TABLET BY MOUTH TWICE A DAY AS NEEDED, Disp: 60 tablet, Rfl: 0   Cholecalciferol (VITAMIN D -3) 125 MCG (5000 UT) TABS, Take by mouth., Disp: , Rfl:     levothyroxine  (SYNTHROID ) 75 MCG tablet, TAKE 1 TABLET BY MOUTH EVERY DAY, Disp: 90 tablet, Rfl: 0   pantoprazole  (PROTONIX ) 40 MG tablet, Take 1 tablet (40 mg total) by mouth 2 (two) times daily., Disp: 60 tablet, Rfl: 2   triamcinolone  cream (KENALOG ) 0.1 %, APPLY TO AFFECTED AREA TWICE A DAY, Disp: 30 g, Rfl: 1   vitamin B-12 (CYANOCOBALAMIN ) 1000 MCG tablet, Take 1,000 mcg by mouth daily., Disp: , Rfl:   Review of Systems:  Negative unless indicated in HPI.   Physical Exam: Vitals:   05/19/24 1602  BP: 124/80  Pulse: (!) 105  Temp: 98.3 F (36.8 C)  TempSrc: Oral  SpO2: 98%  Weight: 152 lb (68.9 kg)    Body mass index is 27.36 kg/m.   Physical Exam Eyes:       Impression and Plan:  Tick bite of left eyelid, initial encounter   - Some very mild swelling is present.  Since tick was attached for less than 24 hours and was not engorged, no antibiotics are needed.  Have advised icing, NSAIDs and antihistamines if needed.  Follow-up if no improvement.  Time spent:21 minutes reviewing chart, interviewing and examining patient and formulating  plan of care.     Tully Theophilus Andrews, MD [facility]

## 2024-05-19 NOTE — Telephone Encounter (Signed)
 noted

## 2024-05-19 NOTE — Telephone Encounter (Signed)
 FYI Only or Action Required?: FYI only for provider: appointment scheduled on 11/24.  Patient was last seen in primary care on 03/24/2024 by Billy Philippe SAUNDERS, NP.  Called Nurse Triage reporting Tick Removal.  Symptoms began yesterday.  Interventions attempted: OTC medications: Alcohol.  Symptoms are: gradually worsening.  Triage Disposition: See Physician Within 24 Hours  Patient/caregiver understands and will follow disposition?: Yes  Copied from CRM #[id number]. Topic: Clinical - Red Word Triage >> May 19, 2024 10:01 AM Alfonso HERO wrote: Red Word that prompted transfer to Nurse Triage: patient has a tick embedded in her eyelid. She has since removed it and now her eye is swollen and sore. Reason for Disposition  [1] Red or very tender (to touch) area AND [2] started over 24 hours after the bite  Answer Assessment - Initial Assessment Questions 1. ATTACHED:  Is the tick still on the skin?  (e.g., yes, no, unsure)     Tick has been removed- embedded in the skin 2. ONSET - TICK STILL ATTACHED:  How long do you think the tick has been on your skin? (e.g., hours, days, unsure)  Note:  Is there a recent activity (camping, hiking) where the caller may have been exposed?     na 3. ONSET - TICK NOT STILL ATTACHED: If the tick has been removed, how long do you think the tick was attached before you removed it? (e.g., 5 hours, 2 days). When was this?     Saturday night- removed Sunday am 4. LOCATION: Where is the tick bite located? (e.g., arm, leg)     Left eyelid/brow 5. TYPE of TICK: Is it a wood tick or a deer tick? (e.g., deer tick, wood tick; unsure)     Unsure- black 6. SIZE of TICK: How big is the tick? (e.g., size of poppy seed, apple seed, watermelon seed; unsure) Note: Deer ticks can be the size of a poppy seed (nymph) or an apple seed (adult).       Black dot- not tiny 7. ENGORGED: Did the tick look flat or engorged (full, swollen)? (e.g., flat, engorged; unsure)      Tick was not engorge- seemed to be dead when removed- it feel apart 8. OTHER SYMPTOMS: Do you have any other symptoms? (e.g., fever, rash, redness at bite area, red ring around bite)     Redness, swollen this morning  Protocols used: Tick Bite-A-AH

## 2024-05-30 ENCOUNTER — Ambulatory Visit: Payer: Self-pay

## 2024-05-30 ENCOUNTER — Encounter: Payer: Self-pay | Admitting: Family Medicine

## 2024-05-30 ENCOUNTER — Ambulatory Visit: Admitting: Family Medicine

## 2024-05-30 VITALS — BP 130/72 | HR 110 | Temp 98.7°F | Wt 149.2 lb

## 2024-05-30 DIAGNOSIS — E210 Primary hyperparathyroidism: Secondary | ICD-10-CM | POA: Diagnosis not present

## 2024-05-30 DIAGNOSIS — R319 Hematuria, unspecified: Secondary | ICD-10-CM | POA: Diagnosis not present

## 2024-05-30 LAB — POC URINALSYSI DIPSTICK (AUTOMATED)
Bilirubin, UA: NEGATIVE
Blood, UA: POSITIVE
Glucose, UA: NEGATIVE
Ketones, UA: NEGATIVE
Nitrite, UA: NEGATIVE
Protein, UA: NEGATIVE
Spec Grav, UA: 1.01 (ref 1.010–1.025)
Urobilinogen, UA: 0.2 U/dL
pH, UA: 6 (ref 5.0–8.0)

## 2024-05-30 LAB — URINALYSIS, MICROSCOPIC ONLY

## 2024-05-30 NOTE — Progress Notes (Signed)
 Established Patient Office Visit   Subjective  Patient ID: Tonya Cherry, female    DOB: 1950/10/22  Age: 73 y.o. MRN: [id number]  Chief Complaint  Patient presents with   Acute Visit    Patient came in today for blood in her urine, started today.    Patient is a 73 year old female followed by Dr. Theophilus and seen for acute concern.  Patient endorses noticing a light pink color to urine around 2 AM this morning.  Patient urinated again at 4 AM and noted urine to be normal.  No hematuria since.  Drinking more water today.  May have had mild burning but doesn't hurt.  Denies back pain, nausea, vomiting, constipation, flank pain, pain that comes and goes.  Patient states she has high calcium  blood.  States her endocrinologist wants her to drink at least 50 ounces of water per day.  Patient may drink 20 ounces of water.  Does not use restroom all day while at work.    Patient Active Problem List   Diagnosis Date Noted   Primary hyperparathyroidism 01/10/2023   Hypercalcemia 09/03/2018   Vitamin D  deficiency 05/31/2018   Osteoporosis 05/30/2018   Carotid artery stenosis 05/20/2012   Dyslipidemia 05/09/2012   Impaired glucose tolerance 05/09/2012   Tobacco abuse 05/09/2012   Hypothyroidism 01/05/2012   Past Medical History:  Diagnosis Date   Allergy    Anxiety    Arthritis    COPD (chronic obstructive pulmonary disease) (HCC)    Depression    GERD (gastroesophageal reflux disease)    Hyperlipidemia    Osteoporosis    Thyroid  disease    Ulcer    Past Surgical History:  Procedure Laterality Date   CESAREAN SECTION     TUBAL LIGATION     Social History   Tobacco Use   Smoking status: Every Day    Current packs/day: 1.00    Types: Cigarettes   Smokeless tobacco: Never  Vaping Use   Vaping status: Never Used  Substance Use Topics   Alcohol use: No   Drug use: No   Family History  Problem Relation Age of Onset   Heart disease Mother    Stroke Mother     Alcohol abuse Father    Prostate cancer Father    Colon cancer Maternal Grandmother    Cancer Maternal Grandmother    Colon polyps Brother    Breast cancer Neg Hx    Esophageal cancer Neg Hx    Rectal cancer Neg Hx    Stomach cancer Neg Hx    Hypercalcemia Neg Hx    Pancreatic cancer Neg Hx    Allergies  Allergen Reactions   Penicillins Cross Reactors Rash   Sulfa Antibiotics Rash   Other Diarrhea    mycin     ROS Negative unless stated above    Objective:     BP (!) 140/78 (BP Location: Left Arm, Patient Position: Sitting, Cuff Size: Large)   Pulse (!) 110   Temp 98.7 F (37.1 C) (Oral)   Wt 149 lb 3.2 oz (67.7 kg)   SpO2 97%   BMI 26.85 kg/m  BP Readings from Last 3 Encounters:  05/30/24 (!) 140/78  05/19/24 124/80  03/24/24 124/82   Wt Readings from Last 3 Encounters:  05/30/24 149 lb 3.2 oz (67.7 kg)  05/19/24 152 lb (68.9 kg)  03/24/24 149 lb (67.6 kg)      Physical Exam Constitutional:      General: She is not in  acute distress.    Appearance: Normal appearance.  HENT:     Head: Normocephalic and atraumatic.     Nose: Nose normal.     Mouth/Throat:     Mouth: Mucous membranes are moist.  Cardiovascular:     Rate and Rhythm: Normal rate and regular rhythm.     Heart sounds: Normal heart sounds. No murmur heard.    No gallop.  Pulmonary:     Effort: Pulmonary effort is normal. No respiratory distress.     Breath sounds: Normal breath sounds. No wheezing, rhonchi or rales.  Abdominal:     General: Bowel sounds are normal.     Palpations: Abdomen is soft.     Tenderness: There is no abdominal tenderness. There is no right CVA tenderness, left CVA tenderness, guarding or rebound.  Skin:    General: Skin is warm and dry.  Neurological:     Mental Status: She is alert and oriented to person, place, and time.        03/24/2024    2:25 PM 07/25/2023    8:48 AM 07/11/2023    4:17 PM  Depression screen PHQ 2/9  Decreased Interest 1 1 1   Down,  Depressed, Hopeless 1 1 1   PHQ - 2 Score 2 2 2   Altered sleeping 1  1  Tired, decreased energy 1  1  Change in appetite 0  0  Feeling bad or failure about yourself  0  0  Trouble concentrating 0  0  Moving slowly or fidgety/restless 0  0  Suicidal thoughts 0  0  PHQ-9 Score 4   4   Difficult doing work/chores Not difficult at all       Data saved with a previous flowsheet row definition      03/24/2024    2:25 PM 12/27/2022    9:24 AM  GAD 7 : Generalized Anxiety Score  Nervous, Anxious, on Edge 1 1  Control/stop worrying 1 1  Worry too much - different things 1 1  Trouble relaxing 1 1  Restless 1 1  Easily annoyed or irritable 1 1  Afraid - awful might happen 1 1  Total GAD 7 Score 7 7  Anxiety Difficulty Somewhat difficult Somewhat difficult     Results for orders placed or performed in visit on 05/30/24  POCT Urinalysis Dipstick (Automated)  Result Value Ref Range   Color, UA yellow    Clarity, UA Cloudy    Glucose, UA Negative Negative   Bilirubin, UA neg    Ketones, UA neg    Spec Grav, UA 1.010 1.010 - 1.025   Blood, UA positive    pH, UA 6.0 5.0 - 8.0   Protein, UA Negative Negative   Urobilinogen, UA 0.2 0.2 or 1.0 E.U./dL   Nitrite, UA neg    Leukocytes, UA Large (3+) (A) Negative      Assessment & Plan:   Hematuria, unspecified type -     POCT Urinalysis Dipstick (Automated) -     Urine Culture; Future -     Urine Microscopic  Primary hyperparathyroidism  Acute urinary complaint with concern for UTI versus renal calculi.  POC UA with hematuria and leuks.  Obtain UCX and micro.  Symptoms of currently resolved.  Will wait on results.  Further recommendations if needed based on results.  Patient advised to increase intake of water and fluids given history of primary hyperparathyroidism causing increased calcium .  Given precautions.  Follow-up with PCP as needed.  Return  if symptoms worsen or fail to improve.   Clotilda JONELLE Single, MD

## 2024-05-30 NOTE — Telephone Encounter (Signed)
 FYI Only or Action Required?: FYI only for provider: appointment scheduled on 12/5.  Patient was last seen in primary care on 05/19/2024 by Tonya Cherry, Tully GRADE, MD.  Called Nurse Triage reporting Hematuria.  Symptoms began today.  Interventions attempted: Rest, hydration, or home remedies.  Symptoms are: gradually improving.  Triage Disposition: See Physician Within 24 Hours  Patient/caregiver understands and will follow disposition?: Yes      Copied from CRM #[id number]. Topic: Clinical - Red Word Triage >> May 30, 2024  8:09 AM Rosina BIRCH wrote: Reason for RMF:aonni in urine that started this morning Reason for Disposition  Pain or burning with passing urine  Answer Assessment - Initial Assessment Questions 1. COLOR of URINE: Describe the color of the urine.  (e.g., tea-colored, pink, red, bloody) Do you have blood clots in your urine? (e.g., none, pea, grape, small coin)     Pink, no clots   2. ONSET: When did the bleeding start?      Today   3. EPISODES: How many times has there been blood in the urine? or How many times today?      Patient noticed at 2 am this morning with that urine, now resolved   4. PAIN with URINATION: Is there any pain with passing your urine? If Yes, ask: How bad is the pain?  (Scale 1-10; or mild, moderate, severe)     No pain- burning sensation   5. FEVER: Do you have a fever? If Yes, ask: What is your temperature, how was it measured, and when did it start?     No   6. ASSOCIATED SYMPTOMS: Are you passing urine more frequently than usual?     No   7. OTHER SYMPTOMS: Do you have any other symptoms? (e.g., back/flank pain, abdomen pain, vomiting)  No    Patient called in to triage with complaints of possible blood in urine, the urine was pink tinges and began with her first urine this morning around 2 am.  The patient stated the pink tint has disappeared, but mild burning noted.  Appointment scheduled for further  evaluation; and agrees with the plan of care, and will reach out if symptoms worsen or persist.  Protocols used: Urine - Blood In-A-AH

## 2024-05-31 ENCOUNTER — Ambulatory Visit: Payer: Self-pay | Admitting: Family Medicine

## 2024-05-31 DIAGNOSIS — R3989 Other symptoms and signs involving the genitourinary system: Secondary | ICD-10-CM

## 2024-05-31 DIAGNOSIS — R319 Hematuria, unspecified: Secondary | ICD-10-CM

## 2024-05-31 LAB — URINE CULTURE
MICRO NUMBER:: 17318934
Result:: NO GROWTH
SPECIMEN QUALITY:: ADEQUATE

## 2024-05-31 MED ORDER — NITROFURANTOIN MONOHYD MACRO 100 MG PO CAPS: 100.0000 mg | ORAL_CAPSULE | Freq: Two times a day (BID) | ORAL | 0 refills | Status: AC

## 2024-05-31 NOTE — Telephone Encounter (Signed)
 Pt called regarding mychart msg and results of urine micro.  Advised may have passed a stone.  Ucx still pending.  Pt without hematuria or other symptoms at this time.  Trying to increase water intake, now at 30 oz.  Rx for macrobid  sent to pharmacy. Further recs if needed based on cx results.  Tonya Single MD

## 2024-06-02 NOTE — Telephone Encounter (Signed)
 Patient is aware.  Appointment is was offered, but the patient declined.

## 2024-06-02 NOTE — Telephone Encounter (Signed)
 Patient has seen her lab results and would like to know if she should continue her antibiotics?

## 2024-06-02 NOTE — Telephone Encounter (Signed)
 No as no uti noted. F/u with pcp.

## 2024-06-04 ENCOUNTER — Telehealth: Payer: Self-pay | Admitting: *Deleted

## 2024-06-04 NOTE — Telephone Encounter (Signed)
 Spoke with patient per Dr. Mercer, patient does not need to take medication, patient does not have a UTI, culture results are Neg. Patient is aware

## 2024-06-04 NOTE — Telephone Encounter (Signed)
 Copied from CRM [id number]. Topic: Clinical - Medical Advice >> Jun 04, 2024 12:02 PM Emylou G wrote: Reason for CRM: Patient calling us  back.. doesn't know if she needs to take antibiotic or not.SABRA

## 2024-06-06 ENCOUNTER — Ambulatory Visit

## 2024-06-17 ENCOUNTER — Other Ambulatory Visit: Payer: Self-pay | Admitting: Internal Medicine

## 2024-06-17 DIAGNOSIS — F419 Anxiety disorder, unspecified: Secondary | ICD-10-CM

## 2024-06-18 ENCOUNTER — Other Ambulatory Visit: Payer: Self-pay | Admitting: Internal Medicine

## 2024-06-18 DIAGNOSIS — E039 Hypothyroidism, unspecified: Secondary | ICD-10-CM

## 2024-07-17 ENCOUNTER — Other Ambulatory Visit: Payer: Self-pay | Admitting: Internal Medicine

## 2024-07-17 DIAGNOSIS — F419 Anxiety disorder, unspecified: Secondary | ICD-10-CM

## 2024-08-12 ENCOUNTER — Ambulatory Visit: Admitting: Family Medicine
